# Patient Record
Sex: Female | Born: 1935 | Race: White | Hispanic: No | Smoking: Never smoker
Health system: Southern US, Community
[De-identification: ages and names within clinical notes are randomized; demographics above are authoritative.]

## PROBLEM LIST (undated history)

## (undated) DIAGNOSIS — M1990 Unspecified osteoarthritis, unspecified site: Secondary | ICD-10-CM

## (undated) DIAGNOSIS — I2510 Atherosclerotic heart disease of native coronary artery without angina pectoris: Secondary | ICD-10-CM

## (undated) DIAGNOSIS — I10 Essential (primary) hypertension: Secondary | ICD-10-CM

## (undated) DIAGNOSIS — T8859XA Other complications of anesthesia, initial encounter: Secondary | ICD-10-CM

## (undated) DIAGNOSIS — I4891 Unspecified atrial fibrillation: Secondary | ICD-10-CM

## (undated) DIAGNOSIS — T4145XA Adverse effect of unspecified anesthetic, initial encounter: Secondary | ICD-10-CM

## (undated) DIAGNOSIS — G5600 Carpal tunnel syndrome, unspecified upper limb: Secondary | ICD-10-CM

## (undated) HISTORY — DX: Atherosclerotic heart disease of native coronary artery without angina pectoris: I25.10

## (undated) HISTORY — DX: Unspecified atrial fibrillation: I48.91

## (undated) HISTORY — DX: Essential (primary) hypertension: I10

## (undated) HISTORY — PX: TONSILLECTOMY: SUR1361

## (undated) HISTORY — PX: CHOLECYSTECTOMY: SHX55

## (undated) HISTORY — PX: INNER EAR SURGERY: SHX679

## (undated) HISTORY — PX: EYE SURGERY: SHX253

---

## 2002-06-04 ENCOUNTER — Emergency Department (HOSPITAL_COMMUNITY): Admission: EM | Admit: 2002-06-04 | Discharge: 2002-06-04 | Payer: Self-pay | Admitting: Emergency Medicine

## 2003-11-16 ENCOUNTER — Encounter (INDEPENDENT_AMBULATORY_CARE_PROVIDER_SITE_OTHER): Payer: Self-pay | Admitting: Specialist

## 2003-11-16 ENCOUNTER — Inpatient Hospital Stay (HOSPITAL_COMMUNITY): Admission: EM | Admit: 2003-11-16 | Discharge: 2003-11-18 | Payer: Self-pay | Admitting: Emergency Medicine

## 2005-08-09 ENCOUNTER — Encounter: Payer: Self-pay | Admitting: Cardiovascular Disease

## 2005-08-23 ENCOUNTER — Ambulatory Visit (HOSPITAL_COMMUNITY): Admission: RE | Admit: 2005-08-23 | Discharge: 2005-08-23 | Payer: Self-pay | Admitting: Cardiovascular Disease

## 2007-04-12 ENCOUNTER — Inpatient Hospital Stay (HOSPITAL_BASED_OUTPATIENT_CLINIC_OR_DEPARTMENT_OTHER): Admission: RE | Admit: 2007-04-12 | Discharge: 2007-04-12 | Payer: Self-pay | Admitting: Cardiology

## 2007-07-26 ENCOUNTER — Inpatient Hospital Stay (HOSPITAL_COMMUNITY): Admission: AD | Admit: 2007-07-26 | Discharge: 2007-07-27 | Payer: Self-pay | Admitting: *Deleted

## 2008-03-03 ENCOUNTER — Inpatient Hospital Stay (HOSPITAL_COMMUNITY): Admission: EM | Admit: 2008-03-03 | Discharge: 2008-03-04 | Payer: Self-pay | Admitting: Emergency Medicine

## 2009-07-16 ENCOUNTER — Encounter: Payer: Self-pay | Admitting: Cardiovascular Disease

## 2009-10-15 ENCOUNTER — Ambulatory Visit: Payer: Self-pay | Admitting: Cardiovascular Disease

## 2010-04-13 ENCOUNTER — Ambulatory Visit (INDEPENDENT_AMBULATORY_CARE_PROVIDER_SITE_OTHER): Payer: Medicare Other | Admitting: Cardiovascular Disease

## 2010-04-13 DIAGNOSIS — I2510 Atherosclerotic heart disease of native coronary artery without angina pectoris: Secondary | ICD-10-CM

## 2010-04-13 DIAGNOSIS — I4891 Unspecified atrial fibrillation: Secondary | ICD-10-CM

## 2010-04-15 ENCOUNTER — Ambulatory Visit: Payer: Self-pay | Admitting: Cardiovascular Disease

## 2010-05-17 LAB — URINE CULTURE: Colony Count: >=100000

## 2010-05-17 LAB — BASIC METABOLIC PANEL
BUN: 14 mg/dL (ref 6–23)
BUN: 21 mg/dL (ref 6–23)
CO2: 25 mEq/L (ref 19–32)
CO2: 31 mEq/L (ref 19–32)
Calcium: 10.3 mg/dL (ref 8.4–10.5)
Calcium: 9.1 mg/dL (ref 8.4–10.5)
Chloride: 94 mEq/L — ABNORMAL LOW (ref 96–112)
Chloride: 96 mEq/L (ref 96–112)
Creatinine, Ser: 0.83 mg/dL (ref 0.4–1.2)
Creatinine, Ser: 1.05 mg/dL (ref 0.4–1.2)
GFR calc Af Amer: >60 mL/min (ref >60)
GFR calc Af Amer: >60 mL/min (ref >60)
GFR calc non Af Amer: 50 mL/min — ABNORMAL LOW (ref >60)
GFR calc non Af Amer: >60 mL/min (ref >60)
Glucose, Bld: 130 mg/dL — ABNORMAL HIGH (ref 70–99)
Glucose, Bld: 162 mg/dL — ABNORMAL HIGH (ref 70–99)
Potassium: 3 mEq/L — ABNORMAL LOW (ref 3.5–5.1)
Potassium: 4.1 mEq/L (ref 3.5–5.1)
Sodium: 132 mEq/L — ABNORMAL LOW (ref 135–145)
Sodium: 135 mEq/L (ref 135–145)

## 2010-05-17 LAB — CARDIAC PANEL(CRET KIN+CKTOT+MB+TROPI)
CK, MB: 2.5 ng/mL (ref 0.3–4.0)
CK, MB: 2.6 ng/mL (ref 0.3–4.0)
Relative Index: 2.4 (ref 0.0–2.5)
Relative Index: INVALID (ref 0.0–2.5)
Total CK: 105 U/L (ref 7–177)
Total CK: 76 U/L (ref 7–177)
Troponin I: 0.01 ng/mL (ref 0.00–0.06)
Troponin I: 0.02 ng/mL (ref 0.00–0.06)

## 2010-05-17 LAB — BRAIN NATRIURETIC PEPTIDE
Pro B Natriuretic peptide (BNP): 136 pg/mL — ABNORMAL HIGH (ref 0.0–100.0)
Pro B Natriuretic peptide (BNP): 212 pg/mL — ABNORMAL HIGH (ref 0.0–100.0)

## 2010-05-17 LAB — URINALYSIS, ROUTINE W REFLEX MICROSCOPIC
Bilirubin Urine: NEGATIVE
Glucose, UA: NEGATIVE mg/dL
Hgb urine dipstick: NEGATIVE
Ketones, ur: NEGATIVE mg/dL
Nitrite: NEGATIVE
Protein, ur: NEGATIVE mg/dL
Specific Gravity, Urine: 1.017 (ref 1.005–1.030)
Urobilinogen, UA: 0.2 mg/dL (ref 0.0–1.0)
pH: 6 (ref 5.0–8.0)

## 2010-05-17 LAB — CBC
HCT: 37.2 % (ref 36.0–46.0)
HCT: 42.5 % (ref 36.0–46.0)
Hemoglobin: 12.4 g/dL (ref 12.0–15.0)
Hemoglobin: 14.5 g/dL (ref 12.0–15.0)
MCHC: 33.5 g/dL (ref 30.0–36.0)
MCHC: 34.2 g/dL (ref 30.0–36.0)
MCV: 88 fL (ref 78.0–100.0)
MCV: 89.4 fL (ref 78.0–100.0)
Platelets: 282 10*3/uL (ref 150–400)
Platelets: 306 10*3/uL (ref 150–400)
RBC: 4.16 MIL/uL (ref 3.87–5.11)
RBC: 4.82 MIL/uL (ref 3.87–5.11)
RDW: 12.7 % (ref 11.5–15.5)
RDW: 12.9 % (ref 11.5–15.5)
WBC: 8.3 10*3/uL (ref 4.0–10.5)
WBC: 8.6 10*3/uL (ref 4.0–10.5)

## 2010-05-17 LAB — POCT I-STAT, CHEM 8
BUN: 23 mg/dL (ref 6–23)
Calcium, Ion: 1.23 mmol/L (ref 1.12–1.32)
Chloride: 96 mEq/L (ref 96–112)
Creatinine, Ser: 1.3 mg/dL — ABNORMAL HIGH (ref 0.4–1.2)
Glucose, Bld: 163 mg/dL — ABNORMAL HIGH (ref 70–99)
HCT: 46 % (ref 36.0–46.0)
Hemoglobin: 15.6 g/dL — ABNORMAL HIGH (ref 12.0–15.0)
Potassium: 4.1 mEq/L (ref 3.5–5.1)
Sodium: 134 mEq/L — ABNORMAL LOW (ref 135–145)
TCO2: 30 mmol/L (ref 0–100)

## 2010-05-17 LAB — CK TOTAL AND CKMB (NOT AT ARMC)
CK, MB: 3.2 ng/mL (ref 0.3–4.0)
Relative Index: 2.6 — ABNORMAL HIGH (ref 0.0–2.5)
Total CK: 122 U/L (ref 7–177)

## 2010-05-17 LAB — POCT CARDIAC MARKERS
CKMB, poc: 2 ng/mL (ref 1.0–8.0)
Myoglobin, poc: 111 ng/mL (ref 12–200)
Troponin i, poc: <0.05 ng/mL (ref 0.00–0.09)

## 2010-05-17 LAB — DIFFERENTIAL
Basophils Absolute: 0 10*3/uL (ref 0.0–0.1)
Basophils Relative: 0 % (ref 0–1)
Eosinophils Absolute: 0.2 10*3/uL (ref 0.0–0.7)
Eosinophils Relative: 2 % (ref 0–5)
Lymphocytes Relative: 38 % (ref 12–46)
Lymphs Abs: 3.1 10*3/uL (ref 0.7–4.0)
Monocytes Absolute: 0.8 10*3/uL (ref 0.1–1.0)
Monocytes Relative: 10 % (ref 3–12)
Neutro Abs: 4.1 10*3/uL (ref 1.7–7.7)
Neutrophils Relative %: 50 % (ref 43–77)

## 2010-05-17 LAB — URINE MICROSCOPIC-ADD ON

## 2010-05-17 LAB — PROTIME-INR
INR: 0.9 (ref 0.00–1.49)
Prothrombin Time: 11.9 seconds (ref 11.6–15.2)

## 2010-05-17 LAB — APTT: aPTT: 22 seconds — ABNORMAL LOW (ref 24–37)

## 2010-05-17 LAB — TROPONIN I: Troponin I: 0.01 ng/mL (ref 0.00–0.06)

## 2010-05-17 LAB — MAGNESIUM: Magnesium: 2.2 mg/dL (ref 1.5–2.5)

## 2010-06-14 NOTE — Discharge Summary (Signed)
Hayley Long, Hayley Long NO.:  [id number]   MEDICAL RECORD NO.:  [id number]          PATIENT TYPE:  INP   LOCATION:  3733                         FACILITY:  [facility]   PHYSICIAN:  Ramiro Harvest, MD    DATE OF BIRTH:  Dec 22, 1920   DATE OF ADMISSION:  03/03/2008  DATE OF DISCHARGE:  03/04/2008                               DISCHARGE SUMMARY   PRIMARY CARE PHYSICIAN:  Caryn Bee L. Little, M.D. of [facility].   DISCHARGE DIAGNOSES:  1. Syncope of unknown etiology likely secondary to urinary tract      infection versus mild  dehydration/orthostasis.  1. Hypokalemia.  2. Dehydration.  3. Paroxysmal atrial fibrillation with left bundle branch block on      electrocardiogram in normal sinus rhythm on day of discharge.  4. Urinary tract infection.  5. Prior history of syncope felt to be secondary to bradycardia and      medication induced. 7.  History of congestive heart failure.  Last      cath March 2009 noting occluded left anterior descending, moderate      disease including a left circumflex being treated medically.  8.      Hypertension.  6. Chronic neck pain.  7. Lower back pain.  8. Status post cataract surgery.  9. History of chronic headaches.  10.Questionable depression.   DISCHARGE MEDICATIONS:  1. Ciprofloxacin 250 mg p.o. b.i.d. x 6 days.  2. Aspirin 81 mg p.o. daily  3. HCTZ 12.5 mg p.o. daily  4. Diovan 160 mg p.o. daily.  5. Calcium as previously taken.  6. Norvasc 5 mg p.o. daily.  7. KCl 8 mEq p.o. daily.  8. Nattokinase take as previously taken.   DISPOSITION:  The patient will be discharged home.  The patient was  adamant in being discharged on the day of discharge.   FOLLOW UP:  1. The patient is to follow up with Cardiology in 1 week for      outpatient 2-D echo and further follow up on this syncope.  The      patient will need a basic metabolic profile checked to follow up on      her electrolytes.  2. The patient is to follow up  with PCP in 1 week.  PCP will need to      follow up on the patient's urine cultures as they were not back by      day of discharge.  The patient was discharged home on Cipro 250 mg      p.o. b.i.d.  Basic metabolic profile needs to be checked on      followup to follow up on electrolytes and renal function.   CONSULTATIONS:  A Cardiology consult was done.  The patient was seen in  consultation by Dr. Elease Hashimoto on March 03, 2008.   PROCEDURES PERFORMED:  1. A chest x-ray was performed on March 03, 2008 that showed low      lung volumes, otherwise no acute cardiopulmonary abnormality,      diffuse idiopathic skeletal hyperostosis  2. CT of the head without contrast was done on  March 03, 2008 that      showed ventriculomegaly disproportionate to degree of atrophy.      This may represent a normal pressure hydrocephalus, mild atrophy      and white matter disease.  This likely reflects the sequelae of      chronic microvascular ischemia, atherosclerosis, prosthetic left      stapes.  3. CT angiogram of the chest was done on March 03, 2008 that showed      no evidence of pulmonary emboli or other acute abnormalities.   BRIEF ADMISSION HISTORY/PHYSICAL:  Ms. Hayley Long is an 77 year old  white female with a past medical history of extensive coronary artery  disease, medically managed CHF, previous episodes of syncope which in  the past have been related to bradycardia, who currently lives at home  with her son.  On the day of admission had a passing out spell.  The  patient was quite anxious,  somewhat depressed and difficult to get a  consistent story, but according to the patient, she had been seen by Dr.  Elease Hashimoto.  She had previous multiple syncopal episodes and had been told  it was because of her heart.  According to medical records, her syncope  in the past had been attributed to Inderal and bradycardia.  This  medication was discontinued. According to her son, she has been  having  lots of lightheaded episodes, but no actual syncopal episodes until on  the day of admission when the patient actually passed out.  The son had  a difficult time waking her up and brought her to the emergency room.  When the patient came to the ED, she was noted to be borderline  hypotensive, but was not tachycardiac.  EKG done showed AFib which was  not seen on previous records as well as the left bundle branch block  which was not documented previously.  In addition, the patient was noted  to have a moderate urinary tract infection, although she did not show  any signs of systemic infection or significant dehydration.  Her renal  function was within normal range.  The patient herself was quite  depressed and anxious.  Absolutely did not want to stay in the hospital  even overnight and it took some coaxing for her to do that.  She told  the admitting physician that she is 68 and felt like this was the end.  She wished that he could have given her some medicine to make it end.  She stated to him that she just hurt so badly especially in her neck and  back that she could barely stand it.  She denied any chest pain or  shortness of breath.  Denied any headaches.  No visual changes or  dysphagia.  She denied any palpitations.  No wheezing, no cough and no  abdominal pain.  No hematuria, no dysuria, no constipation or diarrhea.  Denied any focal extremity numbness, weakness or pain other than that  described above.  However in regards to her syncope, she said she had  that before, although again the son clarified at the last time she had a  syncopal episode was last summer.  At that time, she was brought in and  in the interim has been having a lot of lightheaded episodes.   PHYSICAL EXAMINATION:  VITAL SIGNS:  Temperature 97, heart rate 99,  blood pressure 98/60 up to 131/73, going down as low as 86/56,  respiratory rate of 18, satting 97%  on 2 liters.  Heart rate anywhere  from 103  up to 128.  GENERAL:  The patient was alert and oriented x3.  HEENT:  Normocephalic, atraumatic.  Moist mucous membranes.  No carotid  bruits.  CARDIOVASCULAR:  Regular rhythm with a 2/6 systolic ejection  murmur.  LUNGS:  Clear to auscultation bilaterally.  ABDOMEN:  Soft, obese, nontender, positive bowel sounds.  EXTREMITIES:  No clubbing, cyanosis, trace pitting edema.   LABORATORY DATA:  Admission labs CBC:  White count 8.6, hemoglobin 14.5,  hematocrit 43, MCV of 88, platelet count of 306, INR was therapeutic.  UA showed moderate leukocyte esterase, too numerous to count white blood  cells, only a few bacteria.  Troponin-I of 0.01, CPK of 122, MB of 3.2,  magnesium level of 2.2, sodium of 135, potassium 4.1, chloride 94,  bicarb 31, BUN 21, creatinine of 1.05, glucose of 162.   Discharge labs:  Sodium 132, potassium 3.0, chloride 96, bicarb 25, BUN  14, creatinine 0.83, glucose of 130, calcium of 9.1.  CBC:  White count  8.3, hemoglobin 12.4, platelets of 282, hematocrit of 37.2, ANC of 4.1.   HOSPITAL COURSE:  1. Syncope.  The patient was brought in with a syncopal episode.      There was a possibility of being cardiac in nature as the patient      had prior episodes of syncope.  In addition to the fact that there      was a new AFib seen on EKG per admitting physician and questionable      left bundle branch block.  The CT of the head was obtained with      results as stated above.  Neurological exam was unremarkable and      intact.  Cardiac enzymes were cycled which came back negative x3.      Cardiology was consulted.  The patient was seen in consultation by      Dr. Elease Hashimoto who did not feel this was not cardiac in nature, felt it      might be likely secondary to orthostasis.  The patient had been      placed on IV fluids during the hospitalization to improve her fluid      status.  A head CT was obtained with results as stated above.  A CT      angio of the chest was also  obtained which was negative for      pulmonary embolism.  The patient did not have any further syncopal      episodes during the rest of the hospitalization.  The patient will      follow up with cardiology in 1 week.  We will get an outpatient 2-D      echo.  The patient was also treated for urinary tract infection      with IV Rocephin which may have contributed to her syncopal      episode.  The patient will be transitioned to oral antibiotics to      complete a 7 day course for her urinary tract infection.  The      patient will be discharged in stable condition to follow up with      cardiologist in 1 week and the PCP in another week.  The patient      was very adamant on being discharged today.  2. Dehydration.  The patient was noted to be dehydrated on admission      with systolic blood pressures  dropping in the mid 80s.  The patient      was hydrated with IV fluids and the patient was euvolemic by day of      discharge.  Orthostatics were checked and the patient was not      orthostatic on day of discharge.  The patient will be discharged      home in stable condition.  3. Urinary tract infection.  The patient was noted on admission by UA      to have a urinary tract infection.  The patient was placed on IV      Rocephin.  The patient will be switched to oral ciprofloxacin 250      mg p.o. b.i.d. for 6 more days to complete a 1 week course.  The      patient's urine cultures will need to be followed up per her PCP.  4. Hypokalemia.  The patient was noted to be hypokalemic during the      hospitalization.  The patient's potassium was repleted.  The      patient will be discharged home.  The patient is on chronic      potassium at home.  This will need to be followed up as an      outpatient.  5. New onset atrial fibrillation with questionable left bundle branch      block.  The patient converted into normal sinus rhythm and remained      in normal sinus rhythm for the rest of the  hospitalization.  The      patient was followed up per cardiology.  The patient will follow up      with cardiology as an outpatient.  An outpatient 2-D echo will be      done per Dr. Elease Hashimoto.  6. Depression.  On admission, the patient seemed as if might have a      component of depression going on.  Per the patient's son, the      patient was just depressed at being in the hospital.  The patient      did not have any suicidal ideations.  The patient was back at her      regular baseline on day of discharge with no signs or symptoms of      depression.  This will need to be followed up as an outpatient per      PCP.  7. The rest of the patient's chronic medical issues remained stable      throughout the hospitalization.  The patient will be discharged in      stable and improved condition.   On day of discharge vital signs:  Temperature 98.3, blood pressure  136/73, pulse of 65, respiratory rate 18, satting 91% on room air.   It was a pleasure taking care of Ms. Aurea Graff.      Ramiro Harvest, MD  Electronically Signed     DT/MEDQ  D:  03/04/2008  T:  03/04/2008  Job:  [id number]   cc:   Caryn Bee L. Little, M.D.  Vesta Mixer, M.D.

## 2010-06-14 NOTE — Consult Note (Signed)
NAMEDORIEN, BESSENT NO.:  [id number]   MEDICAL RECORD NO.:  [id number]          PATIENT TYPE:  INP   LOCATION:  3733                         FACILITY:  [facility]   PHYSICIAN:  Vesta Mixer, M.D. DATE OF BIRTH:  [birth year]   DATE OF CONSULTATION:  03/03/2008  DATE OF DISCHARGE:                                 CONSULTATION   Hayley Long is an elderly female with a known history of coronary  artery disease, hypertension, and bradycardia.  She was admitted today  to the hospital after having an episode of syncope.  We were asked by  Dr. Rito Ehrlich to see the patient for further evaluation regarding her  syncope.   Hayley Long is an 77 year old female with a known history of coronary  artery disease.  She has an occluded left anterior descending artery,  which fills via collaterals.  She has been relatively stable and has had  some episodes of angina, but they have always been fairly short lived.   She has had some weak spells that have been not very clearly defined.   She has had some problems with hypertension.   She has been a little bit more depressed recently.   She was at home today and went to the bathroom.  When she got up, she  abruptly fell.  She was found by her son who heard her fall.  She was  out for a couple of minutes, in fact fully did not become completely  oriented until EMS arrived, probably 10 minutes later.  She was brought  to the hospital.  She does not remember anything of episode.  She has  not had any episodes of chest pain or shortness breath.  She has never  had any episodes of syncope similar to this before.  She denies any  bowel or bladder incontinence.  There was no seizure activity noted  according to the son.   She denies any PND or orthopnea.  She denies any worsening of her chest  pain or shortness breath.  She does have some chest pain now that is  pleuritic in nature.  It hurts with a deep breath in, with a cough, and  is centered on the right side.   The patient denies any heat or cold intolerance, weight gain, or weight  loss.   CURRENT MEDICATIONS:  1. Aspirin 81 mg a day.  2. Potassium chloride 8 mEq a day.  3. Calcium twice a day.  4. Osteo-Bi-Flex twice a day.  5. Hydrochlorothiazide 12.5 mg a day.  6. Diovan 160 mg a day.  7. Norvasc 5 mg a day.  8. Multivitamin once a day.  9. Nitroglycerin as needed.  10.Nattokinase once a day.   She is allergic/intolerant to CRESTOR, TRICOR, and LIPITOR.   PAST MEDICAL HISTORY:  1. Coronary artery disease:  She has an occluded left anterior      descending artery that fills via right-to-left collaterals from a      RV marginal branch.  The left circumflex artery has mild      irregularities between 30 and 40%.  The right coronary artery is      small and is nondominant, but it does have a large RV marginal      branch, which supplies the LAD.  She has normal left ventricular      systolic function with an EF of around 65%.  2. History of hypertension.  3. Anxiety and depression.   SOCIAL HISTORY:  The patient is a nonsmoker.  She does not use any  alcohol.   FAMILY HISTORY:  Her father died of a myocardial infarction at age 51.  Her mother died of myocardial infarction at age 43.   REVIEW OF SYSTEMS:  Reviewed in the HPI and all other systems were  reviewed and are negative.   PHYSICAL EXAMINATION:  GENERAL:  She is an elderly female, in no acute  distress.  She did appear to be somewhat depressed.  She is alert and  oriented x3 and her mood and affect are fairly normal, perhaps a little  bit more depressed than usual.  VITAL SIGNS:  Her temperature is 97.5.  Her pulse is 73, blood pressure  is 120/69.  Her O2 sat is 98% on 2 liters.  Her weight is 78 kg.  HEENT:  She is normocephalic and atraumatic.  NECK:  Supple.  Carotids are 2+ without bruits.  There is no JVD, no  thyromegaly.  LUNGS:  Clear.  Her chest wall is nontender.  HEART:   Regular regular rate.  S1 and S2.  ABDOMEN:  Good bowel sounds and is nontender.  There is no  hepatosplenomegaly.  No masses or bruits.  EXTREMITIES:  She has no clubbing, cyanosis, or edema.  SKIN:  Without rashes.  Her gait was not able to be assessed today.  NEUROLOGIC:  Nonfocal.   LABORATORY DATA:  Her cardiac enzymes are negative.  Her creatinine is  1.5.  Her sodium is 135.  Urinalysis is consistent with a urinary tract  infection.   Her CT of her head reveals moderate degree of atrophy.  There was  ventricular enlargement that seem to be out of proportion to the degree  of atrophy, suggestive of normal-pressure hydrocephalus.   Her telemetry strips and her EKG in the emergency room revealed atrial  fibrillation with a rapid ventricular response.  Telemetry strips here  revealed normal sinus rhythm.   1. Hayley Long presents with syncope, new-onset atrial fibrillation,      and pleuritic chest pain.  I would like to do a CT angio of her      chest to rule out a pulmonary embolus.  I did not detect any      palpable cords in her legs, although she certainly is at high risk.      She is not all that mobile.  We will get a CT angio tonight.  We      will start her on heparin and transition her to Coumadin if that is      abnormal.  I do not think that this is an acute coronary syndrome.  She has a  negative cardiac enzymes.  Her angina has been relatively stable up to  now.  1. Depression:  She clearly has some depression.  I get the feeling      that she thinks she is going to die and she      really does not want to die in the hospital.  She has begged me to      send her home.  We  have convinced her to stay tonight to get some      additional testing.  2. Urinary tract infection:  Cultures have been obtained.  Dr.      Rito Ehrlich has started her on Rocephin.      Vesta Mixer, M.D.  Electronically Signed     PJN/MEDQ  D:  03/03/2008  T:  03/04/2008  Job:   [id number]   cc:   Hollice Espy, M.D.  Anna Genre Little, M.D.

## 2010-06-14 NOTE — H&P (Signed)
NAMEMANA, HABERL NO.:  [id number]   MEDICAL RECORD NO.:  [id number]          PATIENT TYPE:  EMS   LOCATION:  MAJO                         FACILITY:  [facility]   PHYSICIAN:  Hollice Espy, M.D.DATE OF BIRTH:  August 13, 1920   DATE OF ADMISSION:  03/03/2008  DATE OF DISCHARGE:                              HISTORY & PHYSICAL   PATIENT'S PCP:  Anna Genre. Little, M.D.   CARDIOLOGIST:  Vesta Mixer, M.D.   CHIEF COMPLAINT:  Syncope.   HISTORY OF PRESENT ILLNESS:  The patient is an 77 year old white female  with past medical history of extensive CAD medically managed, CHF, and  previous episodes of syncope which in the past have been related to  bradycardia who currently lives at home with her son and today had a  passing out spell.  The patient is quite anxious and somewhat depressed  and it is difficult to get a consistent story, but according to the  patient she has been seen by Vesta Mixer, M.D. and she has had  previous multiple syncopal episode has been told it is because of her  heart.  According to the medical records her syncope in the past has  been attributed to Inderal  and bradycardia and this medication was  discontinued.  According to her son she has been having a lot of  lightheaded episodes, but no actual syncopal episodes until today when  the patient actually passed out today and the son had a difficult time  waking her he had a brought into the emergency room.  When the patient  came into the emergency room she was noted to be borderline hypotensive.  She was not tachycardic.  Her EKG done showed a atrial fibrillation  which I am not seeing in the previous record, as well as left bundle-  branch-block which I am not seeing documented previously.  In addition  the patient was noted to have a moderate urinary tract infection,  although she did not show signs of systemic infection or significant  dehydration.  Her renal function was within  the normal range limit.  The  patient herself is quite depressed and anxious.  She absolutely does not  want to stay in the hospital even overnight and it took some coaxing for  her to do.  She tells me that she is 48 and she feels like this is the  end.  She wishes that I would give her some medicine to make it the end  and she says that she just hurts so bad, especially in her neck and back  that she can barely stand it.  She denies any chest pain or shortness of  breath.  She denies any headaches, vision changes or dysphagia.  She  denies any palpitations.  No wheezing, no coughing, no abdominal pain.  No hematuria, dysuria, constipation, or diarrhea.  She denies any focal  extremity numbness, weakness or pain other than described above.  However, in regards to syncope she says she has had this before,  although again, her son clarifies that the last time she had a  syncopal  episode was last summer and that time she was brought in and in the  interim, she gets a lot of lightheaded episodes.   PAST MEDICAL HISTORY:  Includes:  1. History of syncope felt to be secondary to bradycardia and      medication.  2. History of CHF.  Her cardiac catheterization was done in March of      2009, noting occluded LAD, moderate disease including the left      circumflex and medical management.  A ejection fraction was not      commented on at this time.  3. History of hypertension.  4. Chronic neck pain.  5. Lower back pain.  6. Cataract surgery.  7. History of chronic headaches.   MEDICATIONS:  Her medicines currently are as follows:  1. Diovan 160 p.o. daily  2. Aspirin 81 p.o. daily.  3. Hydrochlorothiazide 12.5 p.o. daily.   She has no known drug allergies.   SOCIAL HISTORY:  She denies any tobacco, alcohol or drug use.  She lives  at home with her son.   FAMILY HISTORY:  Noncontributory.   PHYSICAL EXAMINATION:  The patient's vitals on admission, temperature  97, heart rate 99,  blood pressure 98/60, it  has come to as high as  131/73, down as low as 86/56 while she has been here, respiratory rate  is 18, O2 SAT 97% 2 L, heart rate ranges anywhere from 103 up to as high  as 128.  IN GENERAL:  She is alert and oriented x3,  HEENT:  Normocephalic atraumatic.  Mucous membranes moist.  She has no carotid bruits.  HEART:  Irregular rhythm.  When I examined her she was currently under  100, an  appreciable soft 2/6 systolic ejection murmur.  LUNGS:  Clear to auscultation bilaterally.  ABDOMEN:  Soft, obese, nontender, positive bowel sounds.  EXTREMITIES:  Show no clubbing, cyanosis.  Trace pitting edema.   LAB WORK:  White count 8.6, H and H 14.5 and 43, MCV of 88, platelet  count 306.  Her INRs are therapeutic.  UA notes moderate leukocyte  esterase with too numerous to count white cells, but a only few  bacteria.  Troponin-I of 0.01.  CPK 122, MB 3.2.  Magnesium level stable  at 2.2.  Sodium of 135, potassium 4.1, chloride 94, bicarb 31, BUN 21,  creatinine 1.05, glucose 162.   ASSESSMENT/PLAN:  1. Syncope in a patient with a history of congestive heart failure and      coronary artery disease.  2. Atrial fibrillation, questionable new onset  3. New left bundle-branch-block as a sign of worsening coronary artery      disease.  4. Cystitis, possible urinary tract infection.  5. Depression.   This will be difficult to manage the patient.  The patient is pretty  adamant that at best she will stay here overnight.  I have consulted  Vesta Mixer, M.D. who knows this patient very well and can get his  advice on how to proceed.  Likely the best thing to do would be able to  treat the patient's syncope.  It does not look from bradycardia, it  looks to be  slightly more hypotension.  She does not look to be in full  onset heart failure and I question if any of this is from this possible  UTI.  Would plan to gently hydrate.  Her BNP is currently normal.  We  will  go ahead and treat IV  antibiotics.  The other episode, in regard to her depression, she is a  no code blue and I question if the best thing may be to get hospice care  services if Vesta Mixer, M.D. can provide some insight as far as  her life expectancy.  Perhaps we can better manage her pain at home and  set up hospice services.      Hollice Espy, M.D.  Electronically Signed     SKK/MEDQ  D:  03/03/2008  T:  03/03/2008  Job:  [id number]   cc:   Caryn Bee L. Little, M.D.  Vesta Mixer, M.D.

## 2010-06-14 NOTE — Discharge Summary (Signed)
NAMEMAKENLY, LARABEE NO.:  [id number]   MEDICAL RECORD NO.:  [id number]          PATIENT TYPE:  INP   LOCATION:  3707                         FACILITY:  [facility]   PHYSICIAN:  Colleen Can. Deborah Chalk, M.D.DATE OF BIRTH:  [birth year]   DATE OF ADMISSION:  07/26/2007  DATE OF DISCHARGE:  07/27/2007                               DISCHARGE SUMMARY   DISCHARGE DIAGNOSES:  1. Presyncope.  2. Recent frank syncope 2 weeks ago.  3. Known ischemic heart disease.  She has refused even consideration      for coronary artery bypass grafting.  She has been managed      medically.  4. Chronic bradycardia, now off of Inderal.  5. Chronic pain syndrome.  6. Hypertension.   HISTORY OF PRESENT ILLNESS:  The patient is an 77 year old white female  who has multiple medical problems.  She presented for transfer from [facility] to the emergency room after having a passing  out spell.  She was actually going to see the neurologist in [facility]  when she was in route with her son, she apparently got white as she was  very sweaty and clammy.  She really had no chest discomfort but her son  gave her nitroglycerin with worsening of her symptoms.  She thought she  was going to die.  She did have an episode of frank syncope  approximately 2 weeks ago where she fell and hit her chin.  She did not  seek evaluation for that.  She was subsequent transferred to [facility]  for further evaluation.   Please see the dictated history and physical for further patient  presentation and profile.   LABORATORY DATA:  Her chest x-ray was negative.  TSH was normal.  Cardiac enzymes were negative x3; however, her troponins were mildly  elevated at 0.29 to 0.31 to 0.28.  Chemistries were normal except for a  glucose of 208.  CBC was normal.   Her EKG showed sinus bradycardia.   HOSPITAL COURSE:  The patient was transferred from [facility]  ER here to [facility].   Initially, she really refused  evaluation and wanted to be discharged home.  She currently consented  for overnight stay on telemetry.  Her Inderal was discontinued.  She had  no profound bradycardia documented.  At this point in time, she does not  wish for further evaluation and seeks discharge today.   DISCHARGE CONDITION:  Stable.   DISCHARGE DIET:  Low-salt, heart-healthy.   DISCHARGE MEDICINES:  We will be stopping her Inderal.  We will  continue;  1. Diovan 160 a day.  2. Norvasc 5 a day.  3. Aspirin, calcium.  4. Hydrochlorothiazide 25 mg half tablet daily.  5. KCl 8 mEq a day.  6. Multivitamin daily.  7. Tramadol 4 times a day.  8. Nattokinase daily.   We will have her follow up with Dr. Elease Hashimoto in 1 week, certainly sooner  if any problems arise.      Sharlee Blew, N.P.      Colleen Can. Deborah Chalk, M.D.  Electronically Signed    LC/MEDQ  D:  07/27/2007  T:  07/27/2007  Job:  [id number]

## 2010-06-14 NOTE — H&P (Signed)
NAMEAKYAH, LAGRANGE NO.:  [id number]   MEDICAL RECORD NO.:  [id number]          PATIENT TYPE:  INP   LOCATION:  3707                         FACILITY:  [facility]   PHYSICIAN:  Colleen Can. Deborah Chalk, M.D.DATE OF BIRTH:  09-24-1920   DATE OF ADMISSION:  07/26/2007  DATE OF DISCHARGE:                              HISTORY & PHYSICAL   CHIEF COMPLAINT:  Presyncope.   HISTORY OF PRESENT ILLNESS:  The patient is an 77 year old white female  who has multiple medical problems.  She presents for transfer to [facility] actually from the Emergency Room at [facility]  today.  She was on her way with her son to see the neurologist in [facility].  Apparently, she has chronic headaches as well as chronic neck  pain and requires chronic narcotics.  En route, she became white as a  sheet, she got very sweaty and clammy.  She told her son she thought she  was going to die.  He did give her nitroglycerin, which made her  symptoms worse.  She has had a history of syncope 2 weeks ago, in which  she had a chance, she did not seek evaluation.  She is brought here to  the hospital, she is noted to be somewhat bradycardic.  She is now  admitted for further evaluation.   PAST MEDICAL HISTORY:  1. Known history of coronary artery disease.  Her last catheterization      was in March 2009, which showed 70% stenosis in the LAD, circumflex      has 30-40%.  The LAD is basically occluded with collaterals filling      from the right coronary artery.  She has been managed medically.      She has refused even consideration for coronary artery bypass      grafting.  2. Hypertension.  3. Chronic bradycardia.  4. Chronic neck pain.  5. Cataract surgery.  6. Severe arthritis.  7. Chronic headaches.  8. Intolerance to statins.   ALLERGIES:  None.   CURRENT MEDICATIONS:  1. Aspirin.  2. KCl 8 mEq a day.  3. Calcium.  4. Inderal  80 mg a day.  5. Hydrochlorothiazide 12.5 a  day.  6. Diovan 160  a day.  7. Norvasc 5 a day.  8. Multivitamin.  9. Tramadol 4 times a day.  10.Nattokinase, which is an herb for cholesterol.   FAMILY HISTORY:  Noncontributory.   SOCIAL HISTORY:  She lives with her son.  She has no current alcohol or  tobacco use.   REVIEW OF SYSTEMS:  She know she really has not felt well since she had  a cataracts done 2-3 years ago.  She has subsequently been left to have  chronic headaches.  Her chest pain has been increasingly worse over the  last several weeks, but she has refused consideration for bypass  surgery.  She really is very hesitant to proceed on with any type of  further intervention.  She has been bradycardic in the past and has a  long history that is documented in her  medical record.   PHYSICAL EXAMINATION:  GENERAL:  She is currently in no acute distress.  VITAL SIGNS:  Blood pressure is 153/68, her heart rates in the low 50s,  respiratory rate 18, afebrile.  SKIN:  Warm and dry.  Color is somewhat pale.  LUNGS:  Clear.  HEART:  Shows a regular rhythm.  ABDOMEN:  Obese and soft.  Positive bowel sounds and nontender.  EXTREMITIES:  Without edema.  NEUROLOGIC:  Shows no gross focal deficits.  She does have some  ecchymosis at the chin area from recent fall.   LABORATORY DATA:  Her troponin is elevated at 0.33.  CK was negative.  Chest x-ray that was negative.  CBC was normal.  Chemistries were normal  except for potassium of 3.4 and glucose of 136.   OVERALL IMPRESSION:  1. Recurrent episode of presyncope.  2. Known coronary disease with increasing episodes of chest pain.  3. Intolerance to statins.   PLAN:  She is very hesitant to proceed on with any further type of  evaluation.  She is already asking to go home.  We will try to monitor  on telemetry.  Tonight, her Inderal will be discontinued.  Cardiac  profiles will be drawn accordingly.      Sharlee Blew, N.P.      Colleen Can. Deborah Chalk, M.D.   Electronically Signed    LC/MEDQ  D:  07/26/2007  T:  07/27/2007  Job:  [id number]   cc:   Vesta Mixer, M.D.

## 2010-06-14 NOTE — Cardiovascular Report (Signed)
NAMEMarland Kitchen  CARNELIA, OSCAR NO.:  [id number]   MEDICAL RECORD NO.:  [id number]          PATIENT TYPE:  OIB   LOCATION:  1966                         FACILITY:  [facility]   PHYSICIAN:  Vesta Mixer, M.D. DATE OF BIRTH:  [birth year]   DATE OF PROCEDURE:  04/12/2007  DATE OF DISCHARGE:  04/12/2007                            CARDIAC CATHETERIZATION   Hayley Long is an 78 year old female with known coronary artery  disease.  She has an occluded left anterior descending artery.  She  presents with recent worsening of her angina symptoms.  She is referred  for heart catheterization for further evaluation.   PROCEDURE:  Left heart catheterization with coronary angiography.   HEMODYNAMICS:  LV pressure is 163/12.  The aortic pressure is 153/65.   ANGIOGRAPHY:  Left main:  The left main is fairly large and has some  minor luminal irregularities.   The left main has a 70% stenosis at its origin and then is occluded  after giving off a large first diagonal vessel.  The diagonal vessel has  30-40% stenosis at its origin and otherwise no significant  irregularities.   The distal left anterior descending artery fills via collaterals from  the RV marginal branch.   The left circumflex artery is a large branching vessel.  It is dominant.  There are mild to moderate irregularities throughout the course between  30% and 40%.  The first obtuse marginal artery is a very small vessel.  The second obtuse marginal artery is a large vessel with a 40% mid  stenosis.  The posterolateral branches have minor luminal  irregularities.  The posterior descending artery is relatively normal.   The right coronary artery is small and is nondominant.  It has a large  RV marginal branch which provides a very nice collateral to the left  anterior descending artery.  The flow through this vessel is fairly  good.   The left ventriculogram was performed in a 30 RAO position.  It reveals  normal  left ventricular systolic function with an ejection fraction of  65%.   COMPLICATIONS:  None.   CONCLUSIONS:  Coronary arteries and primarily involving the left  anterior descending artery.  She has an occluded LAD with collateral  filling from the right coronary artery.  She has moderate disease  involving the left circumflex artery.   We will continue with medical therapy for now.  She is 77 years old and  is at increased risk for having coronary artery bypass grafting.  Her  coronary disease appears to be stable from several years ago.           ______________________________  Vesta Mixer, M.D.     PJN/MEDQ  D:  04/12/2007  T:  04/13/2007  Job:  [id number]   cc:   Caryn Bee L. Little, M.D.

## 2010-06-14 NOTE — H&P (Signed)
NAMEMarland Kitchen  SHAYE, ELLING NO.:  [id number]   MEDICAL RECORD NO.:  [id number]           PATIENT TYPE:   LOCATION:                                 FACILITY:   PHYSICIAN:  Vesta Mixer, M.D. DATE OF BIRTH:  1920-07-13   DATE OF ADMISSION:  04/08/2007  DATE OF DISCHARGE:                              HISTORY & PHYSICAL   Hayley Long is an elderly female with a history of known coronary  artery disease, hypertension, and bradycardia.  She is referred for  heart catheterization after having some episodes of chest pain  yesterday.   Hayley Long is an 77 year old female with a known history of coronary  artery disease.  She had a heart catheterization in July of 2007 which  revealed a chronic total occlusion of her left anterior descending  artery.  She had collateral filling from a small nondominant right  coronary artery.  She had moderate disease involving the circumflex  artery.  She refused to even consider coronary artery bypass grafting,  and she has done quite well on medical therapy.   On Sunday of this week, she developed some severe chest pain.  It was  associated with diaphoresis, shortness of breath, and nausea.  Her son  stated that she looked very weak and pale.  The pain lasted for some  time, and she took an aspirin and it gradually resolved.  She has not  felt strong since that episode.  She denies any syncope or presyncope.   CURRENT MEDICATIONS:  1. Aspirin 81 mg a day.  2. Potassium chloride 10 mEq a day.  3. Osteo Bi-Flex twice a day.  4. Inderal 80 mg a day.  5. HCTZ 12.5 mg a day.  6. Diovan 160 mg a day.  7. Norvasc 5 mg a day.  8. Multivitamin once a day.  9. __________ kinase once a day.   PHYSICAL EXAMINATION:  GENERAL:  She is an elderly female in no acute  distress.  She is alert and oriented x3.  Her mood and affect are  normal.  VITAL SIGNS:  Weight is 176.  Blood pressure is 142/70, heart rate of  48.  NECK:  2+ carotids.   She has no bruits, no JVD, no thyromegaly.  LUNGS:  Clear to auscultation.  HEART:  Regular rate, S1-S2.  ABDOMEN:  Good bowel sounds, nontender.  EXTREMITIES:  She has no clubbing, cyanosis, or edema.  NEUROLOGIC:  Nonfocal.   Hayley Long presents with some episodes of chest pain, diaphoresis, and  shortness of breath.  She also has significant bradycardia.  I would  like to schedule her for heart catheterization.  We have discussed the  risks, benefits, and options of heart catheterization.  She understands  and agrees to proceed.   Her heart rate is fairly slow, and I suspect that we will need to at  some point discontinue or lower the dose of her Inderal.  At present, it  is providing some antianginal relief, and so I am concerned about  stopping it right away.  Will see if we can get the  heart  catheterization for the next day or so.  If she has any other episodes  of weakness or near-syncope, we will consider stopping the medication.           ______________________________  Vesta Mixer, M.D.     PJN/MEDQ  D:  04/08/2007  T:  04/09/2007  Job:  [id number]   cc:   Caryn Bee L. Little, M.D.

## 2010-06-17 NOTE — Op Note (Signed)
[name], [name] NO.:  [id number]   MEDICAL RECORD NO.:  [id number]          PATIENT TYPE:  INP   LOCATION:  0105                         FACILITY:  [facility]   PHYSICIAN:  Anselm Pancoast. Weatherly, M.D.DATE OF BIRTH:  09-26-1920   DATE OF PROCEDURE:  11/16/2003  DATE OF DISCHARGE:                                 OPERATIVE REPORT   PREOPERATIVE DIAGNOSIS:  Acute cholecystitis.   POSTOPERATIVE DIAGNOSIS:  Acute cholecystitis.   OPERATION:  Laparoscopic cholecystectomy with cholangiogram.   ANESTHESIA:  General.   SURGEON:  Dr. Anselm Pancoast. Weatherly.   ASSISTANT:  Dr. Jimmye Norman.   HISTORY:  Hayley Long is an 77 year old female patient of Dr. Theresia Lo at  [facility] who is referred today after she had had a  CT that showed acute gallbladder at [facility]. The patient states that  she has been having episodes of epigastric pain. Dr. Elease Hashimoto is her  cardiologist, and he had recommended she see her regular physician. She has  just returned from [place] and said that other the last two days she has had  progressive pain, right upper quadrant, with nausea and vomiting. The  patient saw Dr. Theresia Lo this morning who did lab studies and sent her for a  CT that shows acutely inflamed gallbladder. Her white count is 17,000, but  her liver function studies are normal. She was referred over to the ER, and  I saw her, and on examination, she is definitely tender in the right upper  quadrant of her abdomen, and I recommended we proceed with a laparoscopic  cholecystectomy. I talked to Dr. Elease Hashimoto, and he said that she had basically  hypertension and basically controlled congestive heart failure, but she had  no angina.   The patient was given 3 g of Unasyn. She has PSA stockings, positioned on  the OR table, induction of general anesthesia and endotracheal tube and then  oral tube into the stomach. The abdomen was prepped with Betadine and  Surgiscrub solution. She has got a very protuberant abdomen, and abdomen was  prepped with Betadine solution and draped in a sterile manner. A small  incision was made at the umbilicus, the fascia identified. This was  carefully opened, and then a hemostat was used to carefully open into the  peritoneal cavity. A purse-string 0 Vicryl was placed, and then the Hasson  cannula introduced. The upper 10-mm trocar was placed under direct vision.  The two lateral 5-mm trocars were placed at their appropriate position, and  I had anesthetized the fascia for upper trocars. The gallbladder was very  acutely inflamed, very tense. There was bilious fluid around it. I, at  first, aspirated the gallbladder with a __________ aspirator and then could  grasp the gallbladder and retract it upward. The peritoneum at the proximal  portion of the gallbladder was very edematous. This was dissected out. The  cystic duct was identified as was the cystic artery. The cystic artery was  doubly clipped proximally, singly distally divided. The catheter was placed  in the proximal cystic duct. I placed a clip flush with the  gallbladder.  Cholangiogram was obtained. It showed a short cystic duct, good flow into  the duodenum, and a normal extrahepatic biliary system. The catheter was  removed. The cystic duct was triply clipped and divided, and then the rotten  and gangrenous gallbladder was removed. We did have a little bit of bile  spillage from where the grasper was used holding the retractor. There were a  few stones that dropped down. They were small cholesterol stones that were  retrieved, and then the gallbladder was freed from its bed. It was placed in  an EndoCatch bag. The irrigated fluid was aspirated and irrigated, repeat  aspirated, coagulated bed, and then brought the bag containing the  gallbladder out of the umbilical fascia. Placed a second figure-of-eight of  0 Vicryl. I had brought a Blake drain, 19,  out of the lateral 5-mm trocar  and placed in the gallbladder fossa. I cauterized a few little areas of the  bed of the gallbladder, and good hemostasis appeared to have been obtained.  We tried to aspirate much of the fluid as possible. She is very gaseous from  the CT. I did visualize the appendix, and it looks unremarkable. Next, the  irrigated fluid was draining from the drain, and then the carbon dioxide  released after the upper 10-mm trocar was then removed. The subcutaneous  wounds were closed with 4-0 Vicryl, Benzoin and steri strips on the skin. I  am going to keep her on antibiotics for at least 24 hours. She is presently  on Unasyn. I did culture the bile, and I think she can go to the regular  floor.      WJW/MEDQ  D:  11/16/2003  T:  11/17/2003  Job:  [id number]   cc:   Vikki Ports, M.D.  [address] [place]  [place]  [place] [address]  Fax: [phone]

## 2010-06-17 NOTE — H&P (Signed)
Hayley Long, GOTTO NO.:  [id number]   MEDICAL RECORD NO.:  [id number]          PATIENT TYPE:  INP   LOCATION:  0478                         FACILITY:  [facility]   PHYSICIAN:  Anselm Pancoast. Weatherly, M.D.DATE OF BIRTH:  [birth year]   DATE OF ADMISSION:  11/16/2003  DATE OF DISCHARGE:                                HISTORY & PHYSICAL   CHIEF COMPLAINT:  Abdominal pain approximately two to three days' duration.   HISTORY:  Hayley Long is an 77 year old Caucasian female who was referred  to me by Dr. Theresia Lo.  Is also a patient of Dr. Delane Ginger with acute  cholecystitis.  The patient states that she has been having intermittent  episodes of epigastric pain, kind of usually epigastric and is followed by  Dr. Delane Ginger for predominantly hypertension.  She has had a cardiac  catheterization and the ejection fraction was about 55%.  She is 77 years  old and has never had a history of angina.  She was advised to see her  regular physician which she did not do until yesterday.  She had just  returned from a trip to [place] where she had had kind of two days of  increase in right upper quadrant abdominal pain.  She was seen by Dr.  Theresia Lo who referred her for a CT at [facility] and this was consistent  with acute cholecystitis, very swollen, distended, edematous gallbladder  with a lot of pericystic edema and fluid and I was called and she was sent  to the emergency room.  The patient does not appear toxic but she is  definitely tender in the abdomen and has a very large protuberous abdomen.   PAST MEDICAL HISTORY:  She has had no previous abdominal surgery.   ALLERGIES:  Not really.  Says she is possibly allergic to CORTISONE.   MEDICATIONS:  1.  Inderal LA 80 daily.  2.  Hydrochlorothiazide 25 mg.  3.  Baby aspirin.  4.  Norvasc 5 mg daily.  5.  Potassium replacement.  6.  Diovan 160 daily __________.   The patient is divorced.  She has had no colonoscopy  examination.  She does  have a chronic history of constipation.  She had had fever during the  preceding 24 hours.  At Dr. Aurelio Brash office her white count was about  17,000.   PHYSICAL EXAMINATION:  GENERAL:  She appears her stated age.  She is  certainly uncomfortable.  VITAL SIGNS:  Temperature 100.9, pulse 69, respirations 22, blood pressure  154/69.  She is 5 feet 5 inches, weighs 180 pounds.  HEENT:  Appears adequately hydrated.  Not jaundiced.  LYMPH:  No cervical or supraclavicular lymphadenopathy.  LUNGS:  Good breath sounds bilaterally.  CARDIAC:  Normal sinus rhythm.  BREASTS:  Negative.  ABDOMEN:  Very protuberant abdomen.  Definitely tender in the right upper  quadrant.  Not tender in the lower abdomen significantly.  EXTREMITIES:  No definite pedal edema.  Pulses appear adequate.  CNS:  Physiologic.   IMPRESSION:  1.  Acute cholecystitis.  2.  History of out of control congestive  heart failure and hypertension.  3.  Obesity.   PLAN:  Patient was started on IV fluids, antibiotics, and will plan for  urgent laparoscopic cholecystectomy.  I talked to Dr. Delane Ginger and he  said she is basically essentially cleared from urgent surgery and he had  seen the patient approximately two weeks earlier.  She has never had a  history of angina, only hypertension as her predominant cardiac problem.      WJW/MEDQ  D:  11/17/2003  T:  11/17/2003  Job:  [id number]

## 2010-06-17 NOTE — Cardiovascular Report (Signed)
NAMEMarland Kitchen  JALYNNE, PERSICO NO.:  [id number]   MEDICAL RECORD NO.:  [id number]          PATIENT TYPE:  OIB   LOCATION:  2899                         FACILITY:  [facility]   PHYSICIAN:  Vesta Mixer, M.D. DATE OF BIRTH:  [birth year]   DATE OF PROCEDURE:  08/23/2005  DATE OF DISCHARGE:                              CARDIAC CATHETERIZATION   Hayley Long is as an 77 year old female with a history of moderate  coronary artery disease.  She recently had onset of severe shortness of  breath with any sort of exertion.  She had a stress Cardiolite study which  revealed anterior apical ischemia.  She is referred for heart  catheterization for further evaluation.   PROCEDURE:  Left heart catheterization with coronary angiography.   The right femoral artery was easily cannulated using modified Seldinger  technique.   HEMODYNAMIC RESULTS:  The left ventricular pressure 148/11 with an aortic  pressure of 148/57.   ANGIOGRAPHY:  Left main:  The left main has mild irregularities.   Left anterior descending artery has an 80% stenosis at its origin.  It is  incompletely occluded.  It gives off first diagonal artery just prior to  this occlusion.  This first diagonal artery is a moderate sized vessel.  There is no significant stenosis.  The remainder of the LAD fills via  injection from the right coronary artery and seemed to be a moderate to  large vessel with mild to moderate irregularities.   Left circumflex artery is large and dominant.  There are moderate  irregularities between 20-30% throughout the course.  There is a 50%  stenosis distally just prior to giving off the posterolateral branches.  The  circumflex also gives off a posterior descending artery.   RESULTS:  There are several large marginal branches which arise prior to the  50% mid/distal circumflex lesion.   Right coronary artery is small and is nondominant.  There are minor luminal  irregularities.  The  distal right coronary artery supplies a very well-  developed collateral vessel, which feeds up into the proximal LAD.  The LAD  fills fairly nicely from this collateral branch.   The left ventriculogram was performed in a 30 RAO position.  It reveals  overall well preserved left ventricular systolic function.  Ejection  fraction approximately 60%.  There is mild amount of mitral regurgitation.   COMPLICATIONS:  None.   CONCLUSION:  Significant coronary artery disease primarily involving the  left anterior descending artery.  She has a chronic total occlusion of the  left anterior descending with good collateral filling from the right  coronary artery.  She does have evidence of ischemia by Cardiolite scanning.  The lesion is a fairly high risk for angioplasty because  of the significant stenosis that goes all the way up to the left main.  I do  not think that it would be possible to safely put a stent to adequately  cover the lesion without jeopardizing the left main.  We will discuss the  case with CVTS and discuss the case with the patient.  ______________________________  Vesta Mixer, M.D.     PJN/MEDQ  D:  08/23/2005  T:  08/23/2005  Job:  [id number]   cc:   Caryn Bee L. Little, M.D.  Fax: [phone]

## 2010-06-17 NOTE — H&P (Signed)
NAME:  Hayley Long NO.:  0   MEDICAL RECORD NO.:  [id number]           PATIENT TYPE:   LOCATION:                                 FACILITY:   PHYSICIAN:  Vesta Mixer, M.D.      DATE OF BIRTH:   DATE OF ADMISSION:  08/11/2005  DATE OF DISCHARGE:                                HISTORY & PHYSICAL   HISTORY OF PRESENT ILLNESS:  Hayley Long is an elderly female with a  history of hypertension and moderate coronary artery disease.  She recently  has been having some episodes of chest discomfort.  She had a Cardiolite  study which revealed anterior apical ischemia.  She is now referred for  heart catheterization.   Hayley Long has been followed for quite some time.  She had a heart  catheterization approximately 9 years ago.  She was found to have moderate  coronary artery irregularities.  This included a 50% mid LAD stenosis.  She  also had a 50% distal LAD stenosis.  She has overall done fairly well.  She  continues to have intermittent episodes of hypertension.  Recently, she  presented with some episodes of chest discomfort.  These occur very  frequently with exertion.  She states that they typically get better with  rest.  They last for a couple of minutes.  She had a stress Cardiolite study  as noted above and had evidence of anterior wall ischemia.  She was noted to  have normal left ventricular systolic function with an ejection fraction of  61% at that time.   She has a history of hypertension, but this been intermittently fairly well-  controlled.  She states her blood pressure is typically normal when she is  at home, but it goes up a lot when she comes to the doctor.   CURRENT MEDICATIONS:  1.  Aspirin 81 mg a day.  2.  Potassium chloride 10 mEq a day.  3.  Calcium once a day.  4.  Osteo-Bioflex once a day.  5.  Inderal 80 mg a day.  6.  Hydrochlorothiazide 25 mg a day.  7.  Diovan 160 mg a day.  8.  Norvasc 5 mg a day.  9.   Multivitamin once a day.   PAST MEDICAL HISTORY:  1.  Hypertension.  2.  Moderate coronary artery disease.   SOCIAL HISTORY:  The patient is a nonsmoker.   FAMILY HISTORY:  Unremarkable.   REVIEW OF SYSTEMS:  As per HPI and is otherwise unremarkable.   PHYSICAL EXAMINATION:  GENERAL:  She is an elderly female in no acute  distress.  She is alert and oriented x3 and her mood and affect are normal.  Her weight is 179 which is down 7 pounds.  VITAL SIGNS:  Blood pressure is 156/74, heart rate of 52.  HEENT:  Exam reveals 2+ carotids, no bruits, no JVD, no thyromegaly.  LUNGS:  Clear to auscultation.  HEART:  Regular rate S1-S2.  She has no murmurs.  ABDOMEN:  Abdominal exam reveals good bowel sounds and  is nontender.  EXTREMITIES:  She has no clubbing, cyanosis or edema.  Her exam is nonfocal.   LABORATORY DATA AND X-RAY FINDINGS:  Laboratory data is was drawn and is  pending.   ASSESSMENT/PLAN:  Hayley Long presents with chest discomfort.  She is found  to have an abnormal Cardiolite study.  We have scheduled her for heart  catheterization.  We have discussed the risks, benefits and options of heart  catheterization.  She understands and agrees to proceed.  All of her other  medical problems stable.           ______________________________  Vesta Mixer, M.D.     PJN/MEDQ  D:  08/11/2005  T:  08/11/2005  Job:  [id number]   cc:   Caryn Bee L. Little, M.D.  Fax: [phone]

## 2010-06-17 NOTE — Consult Note (Signed)
NAMEMAELEY, MATTON                        ACCOUNT NO.:  [id number]   MEDICAL RECORD NO.:  [id number]                   PATIENT TYPE:  EMS   LOCATION:  [facility]                                 FACILITY:  [facility]   PHYSICIAN:  Vesta Mixer, M.D.              DATE OF BIRTH:  1920-02-02   DATE OF CONSULTATION:  06/04/2002  DATE OF DISCHARGE:                                   CONSULTATION   HISTORY OF PRESENT ILLNESS:  The patient is a 77 year old female with a  history of hypertension, noncompliance and a recent onset of congestive  heart failure.   The patient was last seen in our office in 1998.  At that time she had been  having some chest pain and shortness of breath.  A heart catheterization  revealed minor luminal irregularities.  She was thought to have had possible  previous anterior wall MI because of her abnormal EKG.  She had a relatively  normal left ventricular systolic function and it turned out that she had not  had a myocardial infarction.   She was placed on blood pressure medicines for fairly significant  hypertension at that time but she stopped the medications because she states  that they made her fair fall out. She has not been seen by any doctor over  the past several years.   For the past several weeks she has had progressive shortness of breath with  any sort of exertion.  She also described some pins and needle-like chest  pain.  These episodes of chest pain feel like pins are sticking in her. They  only last for a split second and are not necessary associated with eating,  drinking, change in position, taking a deep breath or exertion.  She was  seen by Dr. Clarene Duke today and was referred to the emergency room.  She was  seen by [place] for further evaluation.   CURRENT MEDICATIONS:  None.   ALLERGIES:  Possible allergy to CODEINE.   PAST MEDICAL HISTORY:  1. Hypertension.  2. New onset congestive heart failure.   HABITS:  The patient is a nonsmoker and  does not drink.   REVIEW OF SYMPTOMS:  The patient complains of shortness of breath for the  past several weeks.  She denies any cough or sputum production.   PHYSICAL EXAMINATION:  GENERAL APPEARANCE:  She is an elderly female in no  acute distress.  VITAL SIGNS:  Blood pressure is 198/108 with heart rate of 62.  NECK:  There are 2+ carotids, she has no bruits.  There is no JVD, no  thyromegaly.  LUNGS:  There are a few bibasilar rales.  CARDIOVASCULAR:  Regular rate, S1 and S2, multiple premature beats.  ABDOMEN:  There are good bowel sounds.  Abdomen is nontender.  She has no  bruits.  EXTREMITIES:  She has no edema.  Her calves are nontender.  NEUROLOGIC:  Examination is  nonfocal.   EKG reveals poor R-wave progression.  She has no ST or T-wave changes.   LABORATORY DATA:  This is basically within normal limits.  A CPK and CPK-MB  are pending.  Electrolytes and CBC are within normal limits.   Her chest x-ray from [facility] reveals very mild  pulmonary edema.   IMPRESSION AND PLAN:  1. Hypertension.  The patient has very poor medical compliance.  She was on     several medications in the past and all of these have been stopped.  We     will start her on Diovan HCT 160 mg/25 mg once a day.  We may have to     decrease the dose of the hydrochlorothiazide for the next several weeks     since we plan on having her on some Lasix.  She will follow up with Dr.     Elease Hashimoto in the office for her hypertension.  2. Congestive heart failure.  The patient has clinical and chest x-ray     evidence of congestive heart failure.  This is most likely due to her     uncontrolled hypertension.  We will start her on Lasix 40 mg a day as     well as potassium chloride 10 mEq a day. We will check an echocardiogram     in the office.  I will see her back in the office in one week.  3. All of her other medical problems remain fairly stable.                                                Vesta Mixer, M.D.    PJN/MEDQ  D:  06/04/2002  T:  06/05/2002  Job:  [id number]   cc:   Caryn Bee L. Little, M.D.  [address]  [place]  [place] [address]  Fax: [phone]

## 2010-07-04 ENCOUNTER — Other Ambulatory Visit: Payer: Self-pay | Admitting: *Deleted

## 2010-07-04 MED ORDER — AMLODIPINE BESYLATE 5 MG PO TABS: 5.0000 mg | ORAL_TABLET | Freq: Every day | ORAL | Status: DC

## 2010-07-04 NOTE — Telephone Encounter (Signed)
Fax received from pharmacy. Refill completed. Jodette Thaer Miyoshi RN  

## 2010-08-17 ENCOUNTER — Other Ambulatory Visit: Payer: Self-pay | Admitting: Family Medicine

## 2010-08-17 DIAGNOSIS — R269 Unspecified abnormalities of gait and mobility: Secondary | ICD-10-CM

## 2010-08-17 DIAGNOSIS — R410 Disorientation, unspecified: Secondary | ICD-10-CM

## 2010-08-18 ENCOUNTER — Ambulatory Visit
Admission: RE | Admit: 2010-08-18 | Discharge: 2010-08-18 | Disposition: A | Discharge status: Home / Self Care | Payer: Medicare Other | Source: Ambulatory Visit | Attending: Family Medicine | Admitting: Family Medicine

## 2010-08-18 DIAGNOSIS — R410 Disorientation, unspecified: Secondary | ICD-10-CM

## 2010-08-18 DIAGNOSIS — R269 Unspecified abnormalities of gait and mobility: Secondary | ICD-10-CM

## 2010-08-18 MED ORDER — IOHEXOL 300 MG/ML  SOLN
75.0000 mL | Freq: Once | INTRAMUSCULAR | Status: AC | PRN
  Administered 2010-08-18: 75 mL via INTRAVENOUS

## 2010-08-22 ENCOUNTER — Other Ambulatory Visit: Payer: Self-pay | Admitting: *Deleted

## 2010-08-22 MED ORDER — HYDROCHLOROTHIAZIDE 25 MG PO TABS: 25.0000 mg | ORAL_TABLET | Freq: Every day | ORAL | Status: DC

## 2010-08-22 NOTE — Telephone Encounter (Signed)
Fax received from pharmacy. Refill completed. Jodette Kerri Kovacik RN  

## 2010-10-27 LAB — PROTIME-INR: INR: 0.9

## 2010-10-27 LAB — CBC
Hemoglobin: 13.7
RDW: 13.6

## 2010-10-27 LAB — CARDIAC PANEL(CRET KIN+CKTOT+MB+TROPI)
CK, MB: 1.3
Relative Index: INVALID
Total CK: 45
Total CK: 67
Troponin I: 0.28 — ABNORMAL HIGH
Troponin I: 0.31 — ABNORMAL HIGH

## 2010-10-27 LAB — COMPREHENSIVE METABOLIC PANEL
ALT: 22
AST: 34
Albumin: 4.2
Alkaline Phosphatase: 50
BUN: 13
Chloride: 98
GFR calc Af Amer: >60
Potassium: 4.2
Sodium: 136
Total Protein: 7.4

## 2010-12-18 ENCOUNTER — Other Ambulatory Visit: Payer: Self-pay | Admitting: Cardiovascular Disease

## 2010-12-29 ENCOUNTER — Other Ambulatory Visit: Payer: Self-pay | Admitting: Cardiovascular Disease

## 2011-02-16 ENCOUNTER — Other Ambulatory Visit: Payer: Self-pay | Admitting: *Deleted

## 2011-02-16 MED ORDER — HYDROCHLOROTHIAZIDE 25 MG PO TABS: 25.0000 mg | ORAL_TABLET | Freq: Every day | ORAL | Status: DC

## 2011-02-16 NOTE — Telephone Encounter (Signed)
Fax Received. Refill Completed. Thomasenia Dowse Chowoe (R.M.A)   

## 2011-04-02 ENCOUNTER — Other Ambulatory Visit: Payer: Self-pay | Admitting: Cardiovascular Disease

## 2011-05-08 ENCOUNTER — Encounter: Payer: Self-pay | Admitting: *Deleted

## 2011-05-18 ENCOUNTER — Other Ambulatory Visit: Payer: Self-pay | Admitting: *Deleted

## 2011-05-18 MED ORDER — HYDROCHLOROTHIAZIDE 25 MG PO TABS: 25.0000 mg | ORAL_TABLET | Freq: Every day | ORAL | Status: DC

## 2011-06-16 ENCOUNTER — Ambulatory Visit (INDEPENDENT_AMBULATORY_CARE_PROVIDER_SITE_OTHER): Payer: Medicare Other | Admitting: Cardiovascular Disease

## 2011-06-16 ENCOUNTER — Encounter: Payer: Self-pay | Admitting: Cardiovascular Disease

## 2011-06-16 VITALS — BP 132/77 | HR 87 | Ht 66.0 in | Wt 133.6 lb

## 2011-06-16 DIAGNOSIS — I4891 Unspecified atrial fibrillation: Secondary | ICD-10-CM

## 2011-06-16 DIAGNOSIS — I10 Essential (primary) hypertension: Secondary | ICD-10-CM | POA: Insufficient documentation

## 2011-06-16 DIAGNOSIS — E785 Hyperlipidemia, unspecified: Secondary | ICD-10-CM | POA: Insufficient documentation

## 2011-06-16 DIAGNOSIS — I2510 Atherosclerotic heart disease of native coronary artery without angina pectoris: Secondary | ICD-10-CM

## 2011-06-16 LAB — BASIC METABOLIC PANEL
BUN: 22 mg/dL (ref 6–23)
CO2: 30 mEq/L (ref 19–32)
Calcium: 9.5 mg/dL (ref 8.4–10.5)
Glucose, Bld: 94 mg/dL (ref 70–99)
Sodium: 137 mEq/L (ref 135–145)

## 2011-06-16 LAB — LIPID PANEL
HDL: 61.6 mg/dL (ref >39.00)
Total CHOL/HDL Ratio: 3
VLDL: 29.8 mg/dL (ref 0.0–40.0)

## 2011-06-16 LAB — HEPATIC FUNCTION PANEL: Total Bilirubin: 0.4 mg/dL (ref 0.3–1.2)

## 2011-06-16 MED ORDER — NITROGLYCERIN 0.4 MG SL SUBL: 0.4000 mg | SUBLINGUAL_TABLET | SUBLINGUAL | Status: DC | PRN

## 2011-06-16 NOTE — Assessment & Plan Note (Signed)
She's been intolerant to multiple medications in the past. We'll go ahead and check her lipids again today.

## 2011-06-16 NOTE — Assessment & Plan Note (Signed)
She is in atrial fibrillation with a controlled ventricular response. She has not been on Coumadin candidate because of her medical noncompliance. We'll continue with her same medications.

## 2011-06-16 NOTE — Progress Notes (Signed)
Rosie Fate Date of Birth  Apr 22, 1920       [facility]    [place] [address]  [address] [place], [place]  [address]   [place], [place]  [address] [phone]     [phone]   Fax  [phone]    Fax [phone]  Problem List: 1. Coronary disease-chronic total occlusion of the LAD- she was offered surgery many years ago but has refused. The LV is not a candidate for PCI. 2. Atrial fibrillation-on Coumadin because of her noncompliance with medications 3. Hypertension 4. Eye surgery  History of Present Illness:  Pt has continued cp on occasion - typically occurs after walking.  Her appetite is slowly declining.  She does not get much exercise. She's very hard of hearing and it was difficult to communicate with her today. She does seem to have some episodes of chest pain. Her son came with her today. They've requested refill on her nitroglycerin.  She has a history of coronary artery disease but has not wanted to have surgery. She's had multiple times that she wants just medical management. In addition, she also refuses lots of medications.  She seems to have reactions to generic medications Aleve largely had her on brand-name medications.   Current Outpatient Prescriptions on File Prior to Visit  Medication Sig Dispense Refill  . aspirin 81 MG tablet Take 81 mg by mouth daily.        [name] CALCIUM PO Take by mouth 2 (two) times daily.        [name] DIOVAN 160 MG tablet TAKE 1 TABLET BY MOUTH EVERY DAY  30 tablet  6  . hydrochlorothiazide (HYDRODIURIL) 25 MG tablet Take 1 tablet (25 mg total) by mouth daily.  30 tablet  2  . KLOR-CON 8 MEQ CR tablet TAKE 1 TABLET BY MOUTH EVERY DAY  30 tablet  5  . Misc Natural Products (OSTEO BI-FLEX JOINT SHIELD PO) Take by mouth 2 (two) times daily.        . Multiple Vitamin (MULTIVITAMIN) tablet Take 1 tablet by mouth daily.        . nitroGLYCERIN (NITROSTAT) 0.4 MG SL tablet Place 0.4 mg under  the tongue every 5 (five) minutes as needed.        . NORVASC 5 MG tablet TAKE 1 TABLET BY MOUTH EVERY DAY  30 tablet  3    Allergies  Allergen Reactions  . Corticosteroids     Past Medical History  Diagnosis Date  . CAD (coronary artery disease)   . Atrial fibrillation   . HTN (hypertension)     Past Surgical History  Procedure Date  . Eye surgery   . Tonsillectomy     History  Smoking status  . Never Smoker   Smokeless tobacco  . Not on file    History  Alcohol Use No    Family History  Problem Relation Age of Onset  . Heart attack    . Diabetes    . Cancer      Reviw of Systems:  Reviewed in the HPI.  All other systems are negative.  Physical Exam: Blood pressure 132/77, pulse 87, height 5\' 6"  (1.676 m), weight 133 lb 9.6 oz (60.601 kg). General: Well developed, well nourished, in no acute distress.  Head: Normocephalic, atraumatic, sclera non-icteric, mucus membranes are moist,   Neck: Supple. Carotids are 2 + without bruits. No JVD  Lungs: Clear bilaterally to auscultation.  Heart: regular rate.  normal  S1 S2. No murmurs, gallops or rubs.  Abdomen: Soft, non-tender, non-distended with normal bowel sounds. No hepatomegaly. No rebound/guarding. No masses.  Msk:  Strength and tone are normal  Extremities: No clubbing or cyanosis. No edema.  Distal pedal pulses are 2+ and equal bilaterally.  Neuro: Alert and oriented X 3. She is very hard of hearing.  Moves all extremities spontaneously.  Psych:  Responds to questions appropriately with a normal affect.  ECG: Jun 16, 2011-atrial fibrillation at a rate of 86 beats per minute. She has left axis deviation. His left bundle branch block.  Assessment / Plan:

## 2011-06-16 NOTE — Patient Instructions (Signed)
Your physician recommends that you return for lab work in: today  Your physician wants you to follow-up in: 1 year. You will receive a reminder letter in the mail two months in advance. If you don't receive a letter, please call our office to schedule the follow-up appointment.   

## 2011-06-16 NOTE — Assessment & Plan Note (Signed)
She has stable coronary artery disease. She refuses surgery many years ago. We'll continue with medical therapy. Her overall health seems to be gradually declining. The chronic total occlusion is not a good candidate for PCI.

## 2011-06-19 ENCOUNTER — Telehealth: Payer: Self-pay | Admitting: *Deleted

## 2011-06-19 DIAGNOSIS — E876 Hypokalemia: Secondary | ICD-10-CM

## 2011-06-19 MED ORDER — POTASSIUM CHLORIDE CRYS ER 10 MEQ PO TBCR: 10.0000 meq | EXTENDED_RELEASE_TABLET | Freq: Every day | ORAL | Status: DC

## 2011-06-19 NOTE — Telephone Encounter (Signed)
Dc 8 mg, new script sent, need repeat lab draw.

## 2011-06-19 NOTE — Telephone Encounter (Signed)
Message copied by Antony Odea on Mon Jun 19, 2011  6:19 PM ------      Message from: Vesta Mixer      Created: Sat Jun 17, 2011  7:14 AM       Cholesterol levels are very good.  Her potassium is low because of her HCTZ .  Start Kdur 10 meq a day.  Recheck in 1 month

## 2011-06-20 ENCOUNTER — Telehealth: Payer: Self-pay | Admitting: Cardiovascular Disease

## 2011-06-20 NOTE — Telephone Encounter (Signed)
Fu call °Pt returning your call  °

## 2011-06-20 NOTE — Telephone Encounter (Signed)
Addended by: Antony Odea on: 06/20/2011 11:31 AM   Modules accepted: Orders

## 2011-06-20 NOTE — Telephone Encounter (Signed)
1 mo app/ lab date given, knows to increase k+ from 8 meq to 10 meq.

## 2011-06-20 NOTE — Telephone Encounter (Signed)
Done see note

## 2011-06-20 NOTE — Telephone Encounter (Signed)
msg left on both numbers, need to return call for medication adjustments, return number provided.

## 2011-07-19 ENCOUNTER — Inpatient Hospital Stay (HOSPITAL_COMMUNITY)
Admission: EM | Admit: 2011-07-19 | Discharge: 2011-07-27 | DRG: 336 | Disposition: A | Discharge status: Home Health | Payer: Medicare Other | Attending: Family Medicine | Admitting: Family Medicine

## 2011-07-19 ENCOUNTER — Emergency Department (HOSPITAL_COMMUNITY): Payer: Medicare Other

## 2011-07-19 ENCOUNTER — Encounter (HOSPITAL_COMMUNITY): Payer: Self-pay | Admitting: *Deleted

## 2011-07-19 DIAGNOSIS — K56609 Unspecified intestinal obstruction, unspecified as to partial versus complete obstruction: Secondary | ICD-10-CM

## 2011-07-19 DIAGNOSIS — Z9089 Acquired absence of other organs: Secondary | ICD-10-CM

## 2011-07-19 DIAGNOSIS — Z79899 Other long term (current) drug therapy: Secondary | ICD-10-CM

## 2011-07-19 DIAGNOSIS — K565 Intestinal adhesions [bands], unspecified as to partial versus complete obstruction: Secondary | ICD-10-CM

## 2011-07-19 DIAGNOSIS — E876 Hypokalemia: Secondary | ICD-10-CM | POA: Diagnosis present

## 2011-07-19 DIAGNOSIS — E785 Hyperlipidemia, unspecified: Secondary | ICD-10-CM

## 2011-07-19 DIAGNOSIS — G5600 Carpal tunnel syndrome, unspecified upper limb: Secondary | ICD-10-CM | POA: Diagnosis present

## 2011-07-19 DIAGNOSIS — I10 Essential (primary) hypertension: Secondary | ICD-10-CM

## 2011-07-19 DIAGNOSIS — K5900 Constipation, unspecified: Secondary | ICD-10-CM | POA: Diagnosis present

## 2011-07-19 DIAGNOSIS — N179 Acute kidney failure, unspecified: Secondary | ICD-10-CM

## 2011-07-19 DIAGNOSIS — N390 Urinary tract infection, site not specified: Secondary | ICD-10-CM

## 2011-07-19 DIAGNOSIS — I4891 Unspecified atrial fibrillation: Secondary | ICD-10-CM

## 2011-07-19 DIAGNOSIS — I509 Heart failure, unspecified: Secondary | ICD-10-CM | POA: Diagnosis present

## 2011-07-19 DIAGNOSIS — E871 Hypo-osmolality and hyponatremia: Secondary | ICD-10-CM | POA: Diagnosis not present

## 2011-07-19 DIAGNOSIS — I959 Hypotension, unspecified: Secondary | ICD-10-CM

## 2011-07-19 DIAGNOSIS — I951 Orthostatic hypotension: Secondary | ICD-10-CM | POA: Diagnosis present

## 2011-07-19 DIAGNOSIS — Z66 Do not resuscitate: Secondary | ICD-10-CM | POA: Diagnosis present

## 2011-07-19 DIAGNOSIS — I2510 Atherosclerotic heart disease of native coronary artery without angina pectoris: Secondary | ICD-10-CM

## 2011-07-19 DIAGNOSIS — D72829 Elevated white blood cell count, unspecified: Secondary | ICD-10-CM | POA: Diagnosis present

## 2011-07-19 HISTORY — DX: Unspecified osteoarthritis, unspecified site: M19.90

## 2011-07-19 HISTORY — DX: Adverse effect of unspecified anesthetic, initial encounter: T41.45XA

## 2011-07-19 HISTORY — DX: Other complications of anesthesia, initial encounter: T88.59XA

## 2011-07-19 HISTORY — DX: Carpal tunnel syndrome, unspecified upper limb: G56.00

## 2011-07-19 LAB — CBC
HCT: 41.6 % (ref 36.0–46.0)
MCV: 88.7 fL (ref 78.0–100.0)
RBC: 4.69 MIL/uL (ref 3.87–5.11)
WBC: 12.2 10*3/uL — ABNORMAL HIGH (ref 4.0–10.5)

## 2011-07-19 LAB — COMPREHENSIVE METABOLIC PANEL
Albumin: 4.2 g/dL (ref 3.5–5.2)
Alkaline Phosphatase: 38 U/L — ABNORMAL LOW (ref 39–117)
BUN: 35 mg/dL — ABNORMAL HIGH (ref 6–23)
Calcium: 10.8 mg/dL — ABNORMAL HIGH (ref 8.4–10.5)
Creatinine, Ser: 1.21 mg/dL — ABNORMAL HIGH (ref 0.50–1.10)
Potassium: 3.5 mEq/L (ref 3.5–5.1)
Total Protein: 7.8 g/dL (ref 6.0–8.3)

## 2011-07-19 LAB — URINALYSIS, ROUTINE W REFLEX MICROSCOPIC
Nitrite: POSITIVE — AB
Specific Gravity, Urine: 1.034 — ABNORMAL HIGH (ref 1.005–1.030)
Urobilinogen, UA: 0.2 mg/dL (ref 0.0–1.0)

## 2011-07-19 LAB — DIFFERENTIAL
Eosinophils Relative: 0 % (ref 0–5)
Lymphocytes Relative: 9 % — ABNORMAL LOW (ref 12–46)
Lymphs Abs: 1.1 10*3/uL (ref 0.7–4.0)
Monocytes Absolute: 0.9 10*3/uL (ref 0.1–1.0)
Monocytes Relative: 7 % (ref 3–12)

## 2011-07-19 LAB — URINE MICROSCOPIC-ADD ON

## 2011-07-19 LAB — PROCALCITONIN: Procalcitonin: <0.1 ng/mL

## 2011-07-19 LAB — LACTIC ACID, PLASMA
Lactic Acid, Venous: 2.7 mmol/L — ABNORMAL HIGH (ref 0.5–2.2)
Lactic Acid, Venous: 2.7 mmol/L — ABNORMAL HIGH (ref 0.5–2.2)

## 2011-07-19 LAB — LIPASE, BLOOD: Lipase: 56 U/L (ref 11–59)

## 2011-07-19 MED ORDER — MORPHINE SULFATE 2 MG/ML IJ SOLN: 0.5000 mg | INTRAMUSCULAR | Status: DC | PRN

## 2011-07-19 MED ORDER — METOPROLOL TARTRATE 1 MG/ML IV SOLN
2.5000 mg | Freq: Four times a day (QID) | INTRAVENOUS | Status: DC
  Filled 2011-07-19 (×5): qty 5

## 2011-07-19 MED ORDER — IOHEXOL 300 MG/ML  SOLN
100.0000 mL | Freq: Once | INTRAMUSCULAR | Status: AC | PRN
  Administered 2011-07-19: 100 mL via INTRAVENOUS

## 2011-07-19 MED ORDER — ASPIRIN 81 MG PO TABS: 81.0000 mg | ORAL_TABLET | Freq: Every day | ORAL | Status: DC

## 2011-07-19 MED ORDER — PIPERACILLIN-TAZOBACTAM 3.375 G IVPB
3.3750 g | Freq: Three times a day (TID) | INTRAVENOUS | Status: DC
  Administered 2011-07-20 – 2011-07-25 (×16): 3.375 g via INTRAVENOUS
  Filled 2011-07-19 (×18): qty 50

## 2011-07-19 MED ORDER — ONDANSETRON HCL 4 MG/2ML IJ SOLN
4.0000 mg | Freq: Once | INTRAMUSCULAR | Status: AC
  Administered 2011-07-19: 4 mg via INTRAVENOUS
  Filled 2011-07-19: qty 2

## 2011-07-19 MED ORDER — SODIUM CHLORIDE 0.9 % IJ SOLN
3.0000 mL | Freq: Two times a day (BID) | INTRAMUSCULAR | Status: DC
  Administered 2011-07-19 – 2011-07-25 (×6): 3 mL via INTRAVENOUS

## 2011-07-19 MED ORDER — ONDANSETRON HCL 4 MG/2ML IJ SOLN: 4.0000 mg | Freq: Three times a day (TID) | INTRAMUSCULAR | Status: AC | PRN

## 2011-07-19 MED ORDER — SODIUM CHLORIDE 0.9 % IV SOLN
INTRAVENOUS | Status: AC
  Administered 2011-07-19: via INTRAVENOUS

## 2011-07-19 MED ORDER — ASPIRIN 81 MG PO CHEW
81.0000 mg | CHEWABLE_TABLET | Freq: Every day | ORAL | Status: DC
  Administered 2011-07-22: 81 mg via ORAL
  Filled 2011-07-19 (×4): qty 1

## 2011-07-19 MED ORDER — LORAZEPAM 2 MG/ML IJ SOLN
1.0000 mg | Freq: Once | INTRAMUSCULAR | Status: AC
  Administered 2011-07-19: 1 mg via INTRAVENOUS
  Filled 2011-07-19: qty 1

## 2011-07-19 MED ORDER — PROMETHAZINE HCL 12.5 MG PO TABS
6.2500 mg | ORAL_TABLET | Freq: Four times a day (QID) | ORAL | Status: DC | PRN
  Filled 2011-07-19: qty 1

## 2011-07-19 MED ORDER — SODIUM CHLORIDE 0.9 % IV SOLN
INTRAVENOUS | Status: DC
  Administered 2011-07-19: 16:00:00 via INTRAVENOUS

## 2011-07-19 MED ORDER — VANCOMYCIN HCL 500 MG IV SOLR
500.0000 mg | INTRAVENOUS | Status: DC
  Administered 2011-07-19: 500 mg via INTRAVENOUS
  Filled 2011-07-19: qty 500

## 2011-07-19 MED ORDER — DEXTROSE 5 % IV SOLN
1.0000 g | Freq: Once | INTRAVENOUS | Status: AC
  Administered 2011-07-19: 1 g via INTRAVENOUS
  Filled 2011-07-19: qty 10

## 2011-07-19 MED ORDER — SODIUM CHLORIDE 0.9 % IV BOLUS (SEPSIS)
500.0000 mL | Freq: Once | INTRAVENOUS | Status: AC
  Administered 2011-07-19: 18:00:00 via INTRAVENOUS

## 2011-07-19 MED ORDER — SODIUM CHLORIDE 0.9 % IV BOLUS (SEPSIS)
500.0000 mL | Freq: Once | INTRAVENOUS | Status: AC
  Administered 2011-07-19: 500 mL via INTRAVENOUS

## 2011-07-19 MED ORDER — LORAZEPAM 2 MG/ML IJ SOLN
0.5000 mg | Freq: Once | INTRAMUSCULAR | Status: AC
  Administered 2011-07-19: 0.5 mg via INTRAVENOUS
  Filled 2011-07-19: qty 1

## 2011-07-19 NOTE — Progress Notes (Signed)
ANTIBIOTIC CONSULT NOTE - INITIAL  Pharmacy Consult for Vancomycin/Zosyn Indication: acute ischemic colitis/leukocytosis  Allergies  Allergen Reactions  . Corticosteroids Swelling    Patient Measurements: Height (as reported on 06/16/11)= 168cm Weight (as reported on 06/16/11)= 60.6 kg   Vital Signs: Temp: 98.5 F (36.9 C) (06/19 1527) Temp src: Oral (06/19 1527) BP: 119/59 mmHg (06/19 1608) Pulse Rate: 116  (06/19 1608)    Intake/Output from this shift: Total I/O In: -  Out: 1000 [Other:1000]  Labs:  Basename 07/19/11 1600  WBC 12.2*  HGB 14.8  PLT 245  LABCREA --  CREATININE 1.21*   No results found for this basename: VANCOTROUGH:2,VANCOPEAK:2,VANCORANDOM:2,GENTTROUGH:2,GENTPEAK:2,GENTRANDOM:2,TOBRATROUGH:2,TOBRAPEAK:2,TOBRARND:2,AMIKACINPEAK:2,AMIKACINTROU:2,AMIKACIN:2, in the last 72 hours   Microbiology: No results found for this or any previous visit (from the past 720 hour(s)).  Medical History: Past Medical History  Diagnosis Date  . CAD (coronary artery disease)   . Atrial fibrillation   . HTN (hypertension)   . Arthritis   . Carpal tunnel syndrome   . Complication of anesthesia     fights coming out of anethesia    Medications:  Scheduled:    . cefTRIAXone (ROCEPHIN)  IV  1 g Intravenous Once  . LORazepam  0.5 mg Intravenous Once  . LORazepam  1 mg Intravenous Once  . ondansetron  4 mg Intravenous Once  . sodium chloride  500 mL Intravenous Once  . sodium chloride  500 mL Intravenous Once   Infusions:    . sodium chloride 125 mL/hr at 07/19/11 1613   PRN: iohexol Assessment: 78 yo patient admitted due to nausea, orthostasis and intractable constipation. Vanco and Zosyn started for leukocytosis/acute ischemic colitis. Scr=1.21 with estimated ClCr=30 ml/min.  Goal of Therapy:  Vancomycin trough level 15-20 mcg/ml  Plan:  . Will start Zosyn 3.375 Gm IV Q8h (4 hr infusion) . Vancomycin 500mg  IV Q24h . Will check Vanco trough level at  steady state or if renal function changes.  Dorethea Clan 07/19/2011,8:08 PM

## 2011-07-19 NOTE — ED Provider Notes (Signed)
History     CSN: [id number]  Arrival date & time 07/19/11  1203   First MD Initiated Contact with Patient 07/19/11 1527      Chief Complaint  Patient presents with  . Constipation  . Dehydration    (Consider location/radiation/quality/duration/timing/severity/associated sxs/prior treatment) HPI Comments: Patient has had ongoing issue with constipation over the past 6 months. She's required use of milk of magnesia. She's had worsening associated abdominal pain and constipation for the past several days. Attempted to use an enema yesterday and had a syncopal event. Denies chest pain, shortness of breath.  Patient is a 78 y.o. female presenting with abdominal pain. The history is provided by the patient. No language interpreter was used.  Abdominal Pain The primary symptoms of the illness include abdominal pain, nausea, vomiting and hematochezia. The primary symptoms of the illness do not include fever, fatigue, shortness of breath, diarrhea, hematemesis, dysuria, vaginal discharge or vaginal bleeding. The current episode started 2 days ago. The onset of the illness was gradual. The problem has been gradually worsening.  The abdominal pain began 2 days ago. The pain came on gradually. The abdominal pain has been gradually worsening since its onset. The abdominal pain is generalized. The abdominal pain does not radiate. The abdominal pain is relieved by nothing.  The hematochezia began more than 1 week ago. The hematochezia has occurred 1 time per day. The hematochezia is a chronic problem.   Risk factors for an acute abdominal problem include being elderly and a history of abdominal surgery. Additional symptoms associated with the illness include anorexia and constipation. Symptoms associated with the illness do not include chills, urgency, frequency or back pain.    Past Medical History  Diagnosis Date  . CAD (coronary artery disease)   . Atrial fibrillation   . HTN (hypertension)   .  Arthritis   . Carpal tunnel syndrome     Past Surgical History  Procedure Date  . Eye surgery   . Tonsillectomy   . Cholecystectomy     Family History  Problem Relation Age of Onset  . Heart attack    . Diabetes    . Cancer      History  Substance Use Topics  . Smoking status: Never Smoker   . Smokeless tobacco: Not on file  . Alcohol Use: No    OB History    Grav Para Term Preterm Abortions TAB SAB Ect Mult Living                  Review of Systems  Constitutional: Negative for fever, chills, activity change, appetite change and fatigue.  HENT: Negative for congestion, sore throat, rhinorrhea, neck pain and neck stiffness.   Respiratory: Negative for cough and shortness of breath.   Cardiovascular: Negative for chest pain and palpitations.  Gastrointestinal: Positive for nausea, vomiting, abdominal pain, constipation, hematochezia and anorexia. Negative for diarrhea and hematemesis.  Genitourinary: Negative for dysuria, urgency, frequency, flank pain, vaginal bleeding and vaginal discharge.  Musculoskeletal: Negative for myalgias, back pain and arthralgias.  Neurological: Positive for syncope (while attempting to have bowel movements) and light-headedness. Negative for dizziness, weakness, numbness and headaches.  All other systems reviewed and are negative.    Allergies  Corticosteroids  Home Medications   Current Outpatient Rx  Name Route Sig Dispense Refill  . AMLODIPINE BESYLATE 5 MG PO TABS Oral Take 5 mg by mouth daily.    [name] CALCIUM CARBONATE 200 MG PO CAPS Oral Take 250 mg by  mouth 2 (two) times daily with a meal.    . HYDROCHLOROTHIAZIDE 25 MG PO TABS Oral Take 25 mg by mouth daily.    Lanetta Inch BI-FLEX JOINT SHIELD PO Oral Take by mouth 2 (two) times daily.      [name] ONE-DAILY MULTI VITAMINS PO TABS Oral Take 1 tablet by mouth daily.      [name] NITROGLYCERIN 0.4 MG SL SUBL Sublingual Place 0.4 mg under the tongue every 5 (five) minutes as needed. Chest pain      . POTASSIUM CHLORIDE CRYS ER 10 MEQ PO TBCR Oral Take 10 mEq by mouth daily.    [name] VALSARTAN 160 MG PO TABS Oral Take 160 mg by mouth daily.    . ASPIRIN 81 MG PO TABS Oral Take 81 mg by mouth daily.        BP 119/59  Pulse 116  Temp 98.5 F (36.9 C) (Oral)  Resp 18  SpO2 96%  Physical Exam  Nursing note and vitals reviewed. Constitutional: She is oriented to person, place, and time. She appears well-developed and well-nourished.  HENT:  Head: Normocephalic and atraumatic.  Mouth/Throat: Oropharynx is clear and moist.  Eyes: Conjunctivae and EOM are normal. Pupils are equal, round, and reactive to light.  Neck: Normal range of motion. Neck supple.  Cardiovascular: Normal rate, normal heart sounds and intact distal pulses.  Exam reveals no gallop and no friction rub.   No murmur heard.      irreg irreg rhythm  Pulmonary/Chest: Effort normal and breath sounds normal. No respiratory distress. She exhibits no tenderness.  Abdominal: Soft. She exhibits distension. There is tenderness. There is no rebound and no guarding.  Genitourinary: Rectal exam shows external hemorrhoid. Rectal exam shows no tenderness. Guaiac negative stool.       Unable to palpate stool on card  Musculoskeletal: Normal range of motion. She exhibits no edema and no tenderness.  Neurological: She is alert and oriented to person, place, and time. She has normal strength. No cranial nerve deficit or sensory deficit.  Skin: Skin is warm and dry. No rash noted.    ED Course  Procedures (including critical care time)   Date: 07/19/2011  Rate: 111  Rhythm: atrial fibrillation  QRS Axis: left  Intervals: normal  ST/T Wave abnormalities: normal  Conduction Disutrbances:left bundle branch block  Narrative Interpretation:   Old EKG Reviewed: unchanged  Labs Reviewed  CBC - Abnormal; Notable for the following:    WBC 12.2 (*)     All other components within normal limits  DIFFERENTIAL - Abnormal; Notable for the  following:    Neutrophils Relative 84 (*)     Neutro Abs 10.2 (*)     Lymphocytes Relative 9 (*)     All other components within normal limits  URINALYSIS, ROUTINE W REFLEX MICROSCOPIC - Abnormal; Notable for the following:    Color, Urine AMBER (*)  BIOCHEMICALS MAY BE AFFECTED BY COLOR   APPearance CLOUDY (*)     Specific Gravity, Urine 1.034 (*)     Bilirubin Urine SMALL (*)     Ketones, ur TRACE (*)     Protein, ur 30 (*)     Nitrite POSITIVE (*)     Leukocytes, UA MODERATE (*)     All other components within normal limits  COMPREHENSIVE METABOLIC PANEL - Abnormal; Notable for the following:    Sodium 131 (*)     Chloride 88 (*)     Glucose, Bld 133 (*)  BUN 35 (*)     Creatinine, Ser 1.21 (*)     Calcium 10.8 (*)     Alkaline Phosphatase 38 (*)     GFR calc non Af Amer 39 (*)     GFR calc Af Amer 45 (*)     All other components within normal limits  LACTIC ACID, PLASMA - Abnormal; Notable for the following:    Lactic Acid, Venous 2.7 (*)     All other components within normal limits  URINE MICROSCOPIC-ADD ON - Abnormal; Notable for the following:    Squamous Epithelial / LPF FEW (*)     Bacteria, UA MANY (*)     All other components within normal limits  LIPASE, BLOOD   Ct Abdomen Pelvis W Contrast  07/19/2011  *RADIOLOGY REPORT*  Clinical Data: Abdominal pain and nausea.  Chronic constipation.  CT ABDOMEN AND PELVIS WITH CONTRAST  Technique:  Multidetector CT imaging of the abdomen and pelvis was performed following the standard protocol during bolus administration of intravenous contrast.  Contrast: OMNIPAQUE IOHEXOL 300 MG/ML  SOLN  Comparison: None.  Findings: Distended stomach and moderately dilated small bowel loops are seen, with transition point in the central pelvis. Nondilated distal small bowel is seen distal to this transition point.  No soft tissue mass or inflammatory process identified. This is consistent with a high-grade small bowel obstruction,  likely due to adhesion.  Bilateral renal cysts are noted but there is no evidence of renal mass or hydronephrosis.  The pancreas, spleen, adrenal glands, and liver are normal in appearance.  Prior cholecystectomy noted.  The uterus and adnexa are unremarkable.  Moderate to large colonic stool burden noted, without significant colonic dilatation.  No focal inflammatory process or abnormal fluid collections are identified.  Foley catheter is seen within the bladder, which is nearly empty.  IMPRESSION:  1.  High-grade distal small bowel obstruction, with transition point in the central pelvis, most likely due to an adhesion. 2.  No mass or inflammatory process identified.  Original Report Authenticated By: Danae Orleans, M.D.   Dg Abd Acute W/chest  07/19/2011  *RADIOLOGY REPORT*  Clinical Data: Nausea and vomiting for 1 day.  ACUTE ABDOMEN SERIES (ABDOMEN 2 VIEW & CHEST 1 VIEW)  Comparison: 11/16/2003.  Findings: There is moderate enlargement of the cardiac silhouette. Ectasia and nonaneurysmal calcification of the thoracic aorta are seen.  No pulmonary infiltrates or nodules were evident.  Hilar and mediastinal contours appear stable. No pleural abnormality is evident.  There is slight thoracic scoliosis convexity to the right.  There is degenerative spondylosis.  No pneumoperitoneum is evident on the left side down decubitus abdominal examination.  There is dilatation of multiple loops of small intestine with some air fluid levels on decubitus examination.  There is moderate gaseous and fluid distention of the transverse colon.  Rectal gas is identified.  No opaque calculi are seen.  Pelvic phleboliths are present.  There is osteophyte formation in the spine.  There is a very dense appearance of the L5 vertebral body.  This could reflect degenerative sclerosis but I cannot exclude blastic lesion.  Does the patient have low back pain?  IMPRESSION: Moderate cardiac silhouette enlargement.  No pulmonary edema,  pneumonia, or pleural effusion.  Scoliosis.  Degenerative spondylosis.  No evidence of pneumoperitoneum.  There is dilatation of multiple loops of small intestine with some air fluid levels on decubitus examination.  There is moderate gaseous and fluid distention of the transverse colon.  Rectal gas is identified. There may be an element of ileus.  The small bowel dilatation is out of proportion to the colonic dilatation and I am concerned that there may be an element of partial small-bowel obstruction.  There is a very dense appearance of the L5 vertebral body.  This could reflect degenerative sclerosis but I cannot exclude blastic lesion.  Does the patient have low back pain?  CT of abdomen and pelvis is scheduled to be performed.  Original Report Authenticated By: Crawford Givens, M.D.     1. SBO (small bowel obstruction)   2. AKI (acute kidney injury)   3. Atrial fibrillation   4. UTI (lower urinary tract infection)       MDM  Small bowel obstruction. Discussed with general surgery. Will admit medically as she has multiple comorbidities. Also with urinary tract infection. Placed on Rocephin. Found to have dehydration with bump in her creatinine and a slightly elevated lactate level. No concern about ischemic bowel. Unable to palpate a stool impaction. An NG tube will be placed. Discussed with the [facility] accepted the patient for admission. She is in atrial fibrillation which is her baseline. No evidence of RVR.  This is likely secondary to adhesions        Dayton Bailiff, MD 07/19/11 1839

## 2011-07-19 NOTE — H&P (Addendum)
History and Physical  AMEENA VESEY ZOX:[id number] DOB: October 08, 1920 DOA: 07/19/2011  Referring physician: PCP: Mickie Hillier, MD   Chief Complaint: Nausea Orthostasis with intractable consitpation  HPI:  H/o taken from Son.  78 yr old female started to have constipation about 2 days ago. And trialed enemas and then had some syncopal episodes-6/17.  Has had consitapttion issues in the past .  Assosc with significant abdominal pain, which started with the consitpation and nausea has started with green emesis which started about 12-18 hours ago.  Usually uses laxative after GB surgery thatshe had in the past.   Probably moire than a dozen episdoes of vomitus, which was green-seemed to have had problem swith this overnight.  Has never had this issue before.  Usually has always been able to take care of this with a laxative.-no stool with the enema He rmobility seems to have chronically slowed down. Went to see he rPCP Caryn Bee Little this am due to the abdominla pain and othe rissues. Has also lost some weight over 6 mo-1.5 years  Chart Review:  Followed chronically by cardiology Dr. Elease Hashimoto since 1998  CHF  Hypertension  History of noncompliance with medical therapy  Acute cholecystitis status post laparoscopic cholecystectomy 11/16/2003, Dr. Zachery Dakins  Status post cardiac catheterization 1998  Cardiac cath 08/11/2005 = LAD = 80% stenosis at origin, chronic total occlusion LAD with good collateral refill right coronary, ischemia notable for Cardiolite scanning-was high risk for angioplasty and PCI was not performed  Repeat cardiac cath 04/12/2007 = occluded LAD with collateral filling from right coronary artery-stable from prior Cath  History of chronic bradycardia, syncopal noted to 07/26/2007  History chronic pain syndrome  Review of Systems:  Somewhat limited given patient just received Ativan for placement of NG tube-states she is positive for headache, positive for blurred  vision, positive for chest pain, positive for abdominal pain. Review of systems is also limited given the patient's extreme difficulty hearing  Past Medical History  Diagnosis Date  . CAD (coronary artery disease)   . Atrial fibrillation   . HTN (hypertension)   . Arthritis   . Carpal tunnel syndrome     Past Surgical History  Procedure Date  . Eye surgery   . Tonsillectomy   . Cholecystectomy     Social History:  reports that she has never smoked. She does not have any smokeless tobacco history on file. She reports that she does not drink alcohol or use illicit drugs.  Allergies  Allergen Reactions  . Corticosteroids Swelling    Family History  Problem Relation Age of Onset  . Heart attack    . Diabetes    . Cancer       Prior to Admission medications   Medication Sig Start Date End Date Taking? Authorizing Provider  amLODipine (NORVASC) 5 MG tablet Take 5 mg by mouth daily.   Yes Historical Provider, MD  calcium carbonate 200 MG capsule Take 250 mg by mouth 2 (two) times daily with a meal.   Yes Historical Provider, MD  hydrochlorothiazide (HYDRODIURIL) 25 MG tablet Take 25 mg by mouth daily. 05/18/11 05/17/12 Yes Vesta Mixer, MD  Misc Natural Products (OSTEO BI-FLEX JOINT SHIELD PO) Take by mouth 2 (two) times daily.     Yes Historical Provider, MD  Multiple Vitamin (MULTIVITAMIN) tablet Take 1 tablet by mouth daily.     Yes Historical Provider, MD  nitroGLYCERIN (NITROSTAT) 0.4 MG SL tablet Place 0.4 mg under the tongue every 5 (five) minutes  as needed. Chest pain 06/16/11  Yes Vesta Mixer, MD  potassium chloride (K-DUR,KLOR-CON) 10 MEQ tablet Take 10 mEq by mouth daily. 06/19/11 06/18/12 Yes Vesta Mixer, MD  valsartan (DIOVAN) 160 MG tablet Take 160 mg by mouth daily.   Yes Historical Provider, MD  aspirin 81 MG tablet Take 81 mg by mouth daily.      Historical Provider, MD   Physical Exam: Filed Vitals:   07/19/11 1527 07/19/11 1603 07/19/11 1607 07/19/11  1608  BP: 118/76 126/77 129/100 119/59  Pulse: 101 99 115 116  Temp: 98.5 F (36.9 C)     TempSrc: Oral     Resp: 18     SpO2: 96%        General:  Alert oriented Caucasian female no apparent distress  Eyes: Arcus senilis no pallor no icterus  ENT: Poor dentition back of mouth not seen  Neck: Soft supple no thyromegaly  Cardiovascular: S1-S2 irregularly irregular. On telemetry sinus tach up to 110  Respiratory: Clinically clear no added sound no tactile vocal resonance and fremitus  Abdomen: Quiet, no bowel sounds heard, no rebound or guarding,  Skin: Lower extremities have no pitting edema  Musculoskeletal: Patient has ganglion cysts on her right hand and fingers with multiple osteoarthritic nodules  Psychiatric: Patient seems appropriate and euthymic  Neurologic: Grossly intact  Labs on Admission:  Basic Metabolic Panel:  Lab 07/19/11 1610  NA 131*  K 3.5  CL 88*  CO2 28  GLUCOSE 133*  BUN 35*  CREATININE 1.21*  CALCIUM 10.8*  MG --  PHOS --    Liver Function Tests:  Lab 07/19/11 1600  AST 26  ALT 10  ALKPHOS 38*  BILITOT 0.9  PROT 7.8  ALBUMIN 4.2    Lab 07/19/11 1600  LIPASE 56  AMYLASE --   No results found for this basename: AMMONIA:5 in the last 168 hours  CBC:  Lab 07/19/11 1600  WBC 12.2*  NEUTROABS 10.2*  HGB 14.8  HCT 41.6  MCV 88.7  PLT 245    Cardiac Enzymes: No results found for this basename: CKTOTAL:5,CKMB:5,CKMBINDEX:5,TROPONINI:5 in the last 168 hours  Troponin (Point of Care Test) No results found for this basename: TROPIPOC in the last 72 hours  BNP (last 3 results) No results found for this basename: PROBNP:3 in the last 8760 hours  CBG: No results found for this basename: GLUCAP:5 in the last 168 hours   Radiological Exams on Admission: Ct Abdomen Pelvis W Contrast  07/19/2011  *RADIOLOGY REPORT*  Clinical Data: Abdominal pain and nausea.  Chronic constipation.  CT ABDOMEN AND PELVIS WITH CONTRAST   Technique:  Multidetector CT imaging of the abdomen and pelvis was performed following the standard protocol during bolus administration of intravenous contrast.  Contrast: OMNIPAQUE IOHEXOL 300 MG/ML  SOLN  Comparison: None.  Findings: Distended stomach and moderately dilated small bowel loops are seen, with transition point in the central pelvis. Nondilated distal small bowel is seen distal to this transition point.  No soft tissue mass or inflammatory process identified. This is consistent with a high-grade small bowel obstruction, likely due to adhesion.  Bilateral renal cysts are noted but there is no evidence of renal mass or hydronephrosis.  The pancreas, spleen, adrenal glands, and liver are normal in appearance.  Prior cholecystectomy noted.  The uterus and adnexa are unremarkable.  Moderate to large colonic stool burden noted, without significant colonic dilatation.  No focal inflammatory process or abnormal fluid collections are identified.  Foley catheter is seen within the bladder, which is nearly empty.  IMPRESSION:  1.  High-grade distal small bowel obstruction, with transition point in the central pelvis, most likely due to an adhesion. 2.  No mass or inflammatory process identified.  Original Report Authenticated By: Danae Orleans, M.D.   Dg Abd Acute W/chest  07/19/2011  *RADIOLOGY REPORT*  Clinical Data: Nausea and vomiting for 1 day.  ACUTE ABDOMEN SERIES (ABDOMEN 2 VIEW & CHEST 1 VIEW)  Comparison: 11/16/2003.  Findings: There is moderate enlargement of the cardiac silhouette. Ectasia and nonaneurysmal calcification of the thoracic aorta are seen.  No pulmonary infiltrates or nodules were evident.  Hilar and mediastinal contours appear stable. No pleural abnormality is evident.  There is slight thoracic scoliosis convexity to the right.  There is degenerative spondylosis.  No pneumoperitoneum is evident on the left side down decubitus abdominal examination.  There is dilatation of  multiple loops of small intestine with some air fluid levels on decubitus examination.  There is moderate gaseous and fluid distention of the transverse colon.  Rectal gas is identified.  No opaque calculi are seen.  Pelvic phleboliths are present.  There is osteophyte formation in the spine.  There is a very dense appearance of the L5 vertebral body.  This could reflect degenerative sclerosis but I cannot exclude blastic lesion.  Does the patient have low back pain?  IMPRESSION: Moderate cardiac silhouette enlargement.  No pulmonary edema, pneumonia, or pleural effusion.  Scoliosis.  Degenerative spondylosis.  No evidence of pneumoperitoneum.  There is dilatation of multiple loops of small intestine with some air fluid levels on decubitus examination.  There is moderate gaseous and fluid distention of the transverse colon.  Rectal gas is identified. There may be an element of ileus.  The small bowel dilatation is out of proportion to the colonic dilatation and I am concerned that there may be an element of partial small-bowel obstruction.  There is a very dense appearance of the L5 vertebral body.  This could reflect degenerative sclerosis but I cannot exclude blastic lesion.  Does the patient have low back pain?  CT of abdomen and pelvis is scheduled to be performed.  Original Report Authenticated By: Crawford Givens, M.D.    EKG: Independently reviewed. EKG shows H. of fibrillation at a rate of 107 PR interval not assessable Axis -50 = left anterior fascicular block, no ST-T wave changes from prior EKG done 03/03/2008   Principal Problem:  *SBO (small bowel obstruction) Active Problems:  Coronary artery disease  Atrial fibrillation  Hyperlipidemia  Hypertension  AKI (acute kidney injury)    Assessment/Plan 1. High-grade Small bowel obstruction-patient will be kept n.p.o.  Dr. Biagio Quint has already been consulted from general surgery. Patient likely will be conservatively managed however will need  cardiology clearance prior to surgery-she is not on Coumadin and I suspect if she needs surgery can be held off aspirin for 3-4 days. Further management as per general surgeon.  An NG tube was attempted x2 in the emergency room and will need to be placed given her continued emesis--follow acute abdominal series ordered for tomorrow morning for comparison sake--need serial abdominal exams, given high-grade nature start empiric antibiotics.  See #7 for further details 2. A fibrillation not on Coumadin-patient be kept on cardiac monitoring with telemetry-she was orthostatic at PCPs office likely secondary to continued HCTZ 25 mg use, Diovan 160 mg use, amlodipine 5 mg use and blood pressure medications will be held. I do not  see that she is on a beta blocker or calcium channel blocker however we will place retropleural IV 5 mg every 6 when necessary for heart rate or blood pressure above 120, 160/100 respectively--would continue perioperative aspirin given latest ACP guidelines 3. Hypotension-this is orthostatic and likely multifactorial secondary to continued blood pressure medication use with poor by mouth oral intake-get orthostatics in the morning, continue IV fluids 75 cc an hour 4. Coronary artery disease without intervention in the past 9Reported chest pain-cycle cardiac enzymes x3. 5. Kidney injury-likely secondary to blood pressure use and poor by mouth intake. Patient bolused IV fluids 1 L emergency room and To 1 pack 25 cc an hour which has been changed to 75 cc an hour, given CAD history. 6. Leukocytosis-probably secondary to pronounced vomiting and nausea, however will repeat CBC in the morning given the differential in her case could be an acute ischemic colitis because she does have a fibrillation and is not on warfarin or any significant blood thinners other than ASA-her lactic acid was 2.7 and I would try and this is well. If it increases this with certainly dictate closer surgical  management-empirically place her on vancomycin and Zosyn which can be discontinued if SBO results 7. Hyponatremia-2/2 poor by mouth intake likely as well as decreased by mouth at oral intake other than fluids 8. Possible dementia-hard to ascertain given patient has received Ativan. Reassessment in a.m. needed  Code Status: DNR but if need be would want surgery--no aggressive ressucitation Family Communication: Son--Phillip Arbutus Ped, healthcare power of attorney  And sister in room [phone] Disposition Plan: Inpatient  Pleas Koch, MD  [facility] Pager [phone]  If 8PM-8AM, please contact floor/night-coverage at [email or web address], password [email or web address] 07/19/2011, 6:34 PM

## 2011-07-19 NOTE — ED Notes (Signed)
Pt son reports the patient has a history of chronic constipation and requires a daily laxative in order to stool. Pt last normal BM was a week ago.  Pt attempted to use two enemas on Sunday and had a syncopal episode after trying to stool. Pt was unable to have a BM. Pt has decreased PO intake as eating and drinking makes her nauseated and causes pain in her abdomen.  Pt also reports lower bilateral abdominal pain and fullness sensation.  Pt denies vomiting. Pt son denies history of constipation being this severe before this.

## 2011-07-19 NOTE — Consult Note (Signed)
Reason for Consult:sbo Referring Physician: Dr. Wilhelmenia Blase is an 78 y.o. female.  HPI: 78yo wf presents with 1 week history of constipation. She has had this problem for a long time. Family states she has not had a bm for about a week. They are not sure when she last passed flatus. She has been vomiting today. CT shows sbo. U/a shows uti.  Past Medical History  Diagnosis Date  . CAD (coronary artery disease)   . Atrial fibrillation   . HTN (hypertension)   . Arthritis   . Carpal tunnel syndrome   . Complication of anesthesia     fights coming out of anethesia    Past Surgical History  Procedure Date  . Eye surgery   . Tonsillectomy   . Cholecystectomy   . Inner ear surgery     bilateral ear surgery - titanium in ears    Family History  Problem Relation Age of Onset  . Heart attack    . Diabetes    . Cancer      Social History:  reports that she has never smoked. She has never used smokeless tobacco. She reports that she does not drink alcohol or use illicit drugs.  Allergies:  Allergies  Allergen Reactions  . Corticosteroids Swelling    Medications: I have reviewed the patient's current medications.  Results for orders placed during the hospital encounter of 07/19/11 (from the past 48 hour(s))  CBC     Status: Abnormal   Collection Time   07/19/11  4:00 PM      Component Value Range Comment   WBC 12.2 (*) 4.0 - 10.5 K/uL    RBC 4.69  3.87 - 5.11 MIL/uL    Hemoglobin 14.8  12.0 - 15.0 g/dL    HCT 40.9  81.1 - 91.4 %    MCV 88.7  78.0 - 100.0 fL    MCH 31.6  26.0 - 34.0 pg    MCHC 35.6  30.0 - 36.0 g/dL    RDW 78.2  95.6 - 21.3 %    Platelets 245  150 - 400 K/uL   DIFFERENTIAL     Status: Abnormal   Collection Time   07/19/11  4:00 PM      Component Value Range Comment   Neutrophils Relative 84 (*) 43 - 77 %    Neutro Abs 10.2 (*) 1.7 - 7.7 K/uL    Lymphocytes Relative 9 (*) 12 - 46 %    Lymphs Abs 1.1  0.7 - 4.0 K/uL    Monocytes Relative 7  3  - 12 %    Monocytes Absolute 0.9  0.1 - 1.0 K/uL    Eosinophils Relative 0  0 - 5 %    Eosinophils Absolute 0.0  0.0 - 0.7 K/uL    Basophils Relative 0  0 - 1 %    Basophils Absolute 0.0  0.0 - 0.1 K/uL   COMPREHENSIVE METABOLIC PANEL     Status: Abnormal   Collection Time   07/19/11  4:00 PM      Component Value Range Comment   Sodium 131 (*) 135 - 145 mEq/L    Potassium 3.5  3.5 - 5.1 mEq/L    Chloride 88 (*) 96 - 112 mEq/L    CO2 28  19 - 32 mEq/L    Glucose, Bld 133 (*) 70 - 99 mg/dL    BUN 35 (*) 6 - 23 mg/dL    Creatinine, Ser 0.86 (*)  0.50 - 1.10 mg/dL    Calcium 81.1 (*) 8.4 - 10.5 mg/dL    Total Protein 7.8  6.0 - 8.3 g/dL    Albumin 4.2  3.5 - 5.2 g/dL    AST 26  0 - 37 U/L    ALT 10  0 - 35 U/L    Alkaline Phosphatase 38 (*) 39 - 117 U/L    Total Bilirubin 0.9  0.3 - 1.2 mg/dL    GFR calc non Af Amer 39 (*) >90 mL/min    GFR calc Af Amer 45 (*) >90 mL/min   LIPASE, BLOOD     Status: Normal   Collection Time   07/19/11  4:00 PM      Component Value Range Comment   Lipase 56  11 - 59 U/L   LACTIC ACID, PLASMA     Status: Abnormal   Collection Time   07/19/11  4:00 PM      Component Value Range Comment   Lactic Acid, Venous 2.7 (*) 0.5 - 2.2 mmol/L   URINALYSIS, ROUTINE W REFLEX MICROSCOPIC     Status: Abnormal   Collection Time   07/19/11  5:21 PM      Component Value Range Comment   Color, Urine AMBER (*) YELLOW BIOCHEMICALS MAY BE AFFECTED BY COLOR   APPearance CLOUDY (*) CLEAR    Specific Gravity, Urine 1.034 (*) 1.005 - 1.030    pH 5.5  5.0 - 8.0    Glucose, UA NEGATIVE  NEGATIVE mg/dL    Hgb urine dipstick NEGATIVE  NEGATIVE    Bilirubin Urine SMALL (*) NEGATIVE    Ketones, ur TRACE (*) NEGATIVE mg/dL    Protein, ur 30 (*) NEGATIVE mg/dL    Urobilinogen, UA 0.2  0.0 - 1.0 mg/dL    Nitrite POSITIVE (*) NEGATIVE    Leukocytes, UA MODERATE (*) NEGATIVE   URINE MICROSCOPIC-ADD ON     Status: Abnormal   Collection Time   07/19/11  5:21 PM      Component  Value Range Comment   Squamous Epithelial / LPF FEW (*) RARE    WBC, UA 11-20  <3 WBC/hpf    Bacteria, UA MANY (*) RARE    Urine-Other MUCOUS PRESENT       Ct Abdomen Pelvis W Contrast  07/19/2011  *RADIOLOGY REPORT*  Clinical Data: Abdominal pain and nausea.  Chronic constipation.  CT ABDOMEN AND PELVIS WITH CONTRAST  Technique:  Multidetector CT imaging of the abdomen and pelvis was performed following the standard protocol during bolus administration of intravenous contrast.  Contrast: OMNIPAQUE IOHEXOL 300 MG/ML  SOLN  Comparison: None.  Findings: Distended stomach and moderately dilated small bowel loops are seen, with transition point in the central pelvis. Nondilated distal small bowel is seen distal to this transition point.  No soft tissue mass or inflammatory process identified. This is consistent with a high-grade small bowel obstruction, likely due to adhesion.  Bilateral renal cysts are noted but there is no evidence of renal mass or hydronephrosis.  The pancreas, spleen, adrenal glands, and liver are normal in appearance.  Prior cholecystectomy noted.  The uterus and adnexa are unremarkable.  Moderate to large colonic stool burden noted, without significant colonic dilatation.  No focal inflammatory process or abnormal fluid collections are identified.  Foley catheter is seen within the bladder, which is nearly empty.  IMPRESSION:  1.  High-grade distal small bowel obstruction, with transition point in the central pelvis, most likely due to an adhesion.  2.  No mass or inflammatory process identified.  Original Report Authenticated By: Danae Orleans, M.D.   Dg Abd Acute W/chest  07/19/2011  *RADIOLOGY REPORT*  Clinical Data: Nausea and vomiting for 1 day.  ACUTE ABDOMEN SERIES (ABDOMEN 2 VIEW & CHEST 1 VIEW)  Comparison: 11/16/2003.  Findings: There is moderate enlargement of the cardiac silhouette. Ectasia and nonaneurysmal calcification of the thoracic aorta are seen.  No pulmonary  infiltrates or nodules were evident.  Hilar and mediastinal contours appear stable. No pleural abnormality is evident.  There is slight thoracic scoliosis convexity to the right.  There is degenerative spondylosis.  No pneumoperitoneum is evident on the left side down decubitus abdominal examination.  There is dilatation of multiple loops of small intestine with some air fluid levels on decubitus examination.  There is moderate gaseous and fluid distention of the transverse colon.  Rectal gas is identified.  No opaque calculi are seen.  Pelvic phleboliths are present.  There is osteophyte formation in the spine.  There is a very dense appearance of the L5 vertebral body.  This could reflect degenerative sclerosis but I cannot exclude blastic lesion.  Does the patient have low back pain?  IMPRESSION: Moderate cardiac silhouette enlargement.  No pulmonary edema, pneumonia, or pleural effusion.  Scoliosis.  Degenerative spondylosis.  No evidence of pneumoperitoneum.  There is dilatation of multiple loops of small intestine with some air fluid levels on decubitus examination.  There is moderate gaseous and fluid distention of the transverse colon.  Rectal gas is identified. There may be an element of ileus.  The small bowel dilatation is out of proportion to the colonic dilatation and I am concerned that there may be an element of partial small-bowel obstruction.  There is a very dense appearance of the L5 vertebral body.  This could reflect degenerative sclerosis but I cannot exclude blastic lesion.  Does the patient have low back pain?  CT of abdomen and pelvis is scheduled to be performed.  Original Report Authenticated By: Crawford Givens, M.D.    Review of Systems  Constitutional: Negative.   HENT: Negative.   Eyes: Negative.   Respiratory: Negative.   Cardiovascular: Negative.   Gastrointestinal: Positive for nausea, vomiting, abdominal pain and constipation.  Genitourinary: Negative.   Musculoskeletal:  Negative.   Skin: Negative.   Neurological: Negative.   Endo/Heme/Allergies: Negative.   Psychiatric/Behavioral: Positive for depression.   Blood pressure 119/59, pulse 116, temperature 98.5 F (36.9 C), temperature source Oral, resp. rate 18, SpO2 96.00%. Physical Exam  Constitutional: She is oriented to person, place, and time. She appears well-developed and well-nourished.  HENT:  Head: Normocephalic and atraumatic.  Eyes: Conjunctivae and EOM are normal. Pupils are equal, round, and reactive to light.  Neck: Normal range of motion. Neck supple.  Cardiovascular:       Irregular rate and rhythm  Respiratory: Effort normal and breath sounds normal.  GI: Soft.       Moderate distension but soft with no guarding or peritonitis. No obvious hernia. Few bs  Musculoskeletal: Normal range of motion.  Neurological: She is alert and oriented to person, place, and time.  Skin: Skin is warm and dry.  Psychiatric: She has a normal mood and affect. Her behavior is normal.    Assessment/Plan: SBO vs ileus in an elderly pt with significant cardiac comorbidity. She also has uti. Agree with medical admit. Would hydrate and continue ng and bowel rest. Treat uti and correct lytes. We will follow closely  with you. Repeat abd xrays in am  TOTH III,Verbon Giangregorio S 07/19/2011, 8:44 PM

## 2011-07-19 NOTE — ED Notes (Signed)
Pt here from PCP with son. Reports pt seen for ongoing constipation and dehydration. Pt used enema 2 nights ago, reports she "passed out" after. Reports rebound tenderness on lower abd, per PCP. Orthostatic vitals assessed, dehydration determined, pcp sent pt to ED for treatment.

## 2011-07-20 ENCOUNTER — Inpatient Hospital Stay (HOSPITAL_COMMUNITY): Payer: Medicare Other

## 2011-07-20 ENCOUNTER — Telehealth: Payer: Self-pay | Admitting: Cardiovascular Disease

## 2011-07-20 DIAGNOSIS — I4891 Unspecified atrial fibrillation: Secondary | ICD-10-CM

## 2011-07-20 DIAGNOSIS — K565 Intestinal adhesions [bands], unspecified as to partial versus complete obstruction: Secondary | ICD-10-CM

## 2011-07-20 DIAGNOSIS — N390 Urinary tract infection, site not specified: Secondary | ICD-10-CM | POA: Diagnosis present

## 2011-07-20 DIAGNOSIS — E876 Hypokalemia: Secondary | ICD-10-CM | POA: Diagnosis present

## 2011-07-20 DIAGNOSIS — E782 Mixed hyperlipidemia: Secondary | ICD-10-CM

## 2011-07-20 DIAGNOSIS — N179 Acute kidney failure, unspecified: Secondary | ICD-10-CM

## 2011-07-20 LAB — COMPREHENSIVE METABOLIC PANEL
AST: 19 U/L (ref 0–37)
Albumin: 3.3 g/dL — ABNORMAL LOW (ref 3.5–5.2)
Alkaline Phosphatase: 28 U/L — ABNORMAL LOW (ref 39–117)
BUN: 27 mg/dL — ABNORMAL HIGH (ref 6–23)
Chloride: 96 mEq/L (ref 96–112)
Creatinine, Ser: 0.83 mg/dL (ref 0.50–1.10)
Potassium: 3 mEq/L — ABNORMAL LOW (ref 3.5–5.1)
Total Protein: 6.1 g/dL (ref 6.0–8.3)

## 2011-07-20 LAB — CBC
HCT: 36 % (ref 36.0–46.0)
MCH: 31.3 pg (ref 26.0–34.0)
MCV: 88.7 fL (ref 78.0–100.0)
RBC: 4.06 MIL/uL (ref 3.87–5.11)
WBC: 7.6 10*3/uL (ref 4.0–10.5)

## 2011-07-20 LAB — GLUCOSE, CAPILLARY: Glucose-Capillary: 91 mg/dL (ref 70–99)

## 2011-07-20 MED ORDER — KCL IN DEXTROSE-NACL 20-5-0.9 MEQ/L-%-% IV SOLN
INTRAVENOUS | Status: AC
  Administered 2011-07-20: 75 mL/h via INTRAVENOUS
  Administered 2011-07-21: 02:00:00 via INTRAVENOUS
  Filled 2011-07-20 (×2): qty 1000

## 2011-07-20 MED ORDER — HYDRALAZINE HCL 20 MG/ML IJ SOLN: 5.0000 mg | Freq: Three times a day (TID) | INTRAMUSCULAR | Status: DC | PRN

## 2011-07-20 MED ORDER — PANTOPRAZOLE SODIUM 40 MG IV SOLR
40.0000 mg | INTRAVENOUS | Status: DC
  Administered 2011-07-20 – 2011-07-26 (×6): 40 mg via INTRAVENOUS
  Filled 2011-07-20 (×9): qty 40

## 2011-07-20 MED ORDER — POTASSIUM CHLORIDE 10 MEQ/100ML IV SOLN
10.0000 meq | INTRAVENOUS | Status: AC
  Administered 2011-07-20 (×4): 10 meq via INTRAVENOUS
  Filled 2011-07-20 (×4): qty 100

## 2011-07-20 MED ORDER — METOPROLOL TARTRATE 1 MG/ML IV SOLN: 2.5000 mg | Freq: Two times a day (BID) | INTRAVENOUS | Status: DC

## 2011-07-20 MED ORDER — VANCOMYCIN HCL 500 MG IV SOLR
500.0000 mg | Freq: Two times a day (BID) | INTRAVENOUS | Status: DC
  Administered 2011-07-21 (×2): 500 mg via INTRAVENOUS
  Filled 2011-07-20 (×3): qty 500

## 2011-07-20 NOTE — Telephone Encounter (Signed)
Family wants Dr Elease Hashimoto to see pt and be involved with care. Pt in [place] for bowel obstruction but having issues with elevated heart rate and hospitalists want to treat it. Family concerned that they are not speaking with you for advise because of your long history, they would feel safer if you were at least consulted but they have been denied to have you consulted stating that it is not customary in this type of case. Pt's family wanted you aware she is there at [facility] long. I told him to voice his concerns to the hospitalist which he has a meeting with this evening, family agreed to plan.

## 2011-07-20 NOTE — Progress Notes (Signed)
Hayley Long is a 78 y.o. female admitted with nausea/constipation, delirium. She was found to have uti/sbo. I have reviewed her chart, seen and examined her in the presence of her daughter at bed isde. Appreciate surgical input.   SUBJECTIVE Feels better.   1. SBO (small bowel obstruction)   2. AKI (acute kidney injury)   3. Atrial fibrillation   4. UTI (lower urinary tract infection)     Past Medical History  Diagnosis Date  . CAD (coronary artery disease)   . Atrial fibrillation   . HTN (hypertension)   . Arthritis   . Carpal tunnel syndrome   . Complication of anesthesia     fights coming out of anethesia   Current Facility-Administered Medications  Medication Dose Route Frequency Provider Last Rate Last Dose  . 0.9 %  sodium chloride infusion   Intravenous STAT Dayton Bailiff, MD 75 mL/hr at 07/19/11 2339    . aspirin chewable tablet 81 mg  81 mg Oral Daily Laveda Norman, MD      . cefTRIAXone (ROCEPHIN) 1 g in dextrose 5 % 50 mL IVPB  1 g Intravenous Once Dayton Bailiff, MD   1 g at 07/19/11 1801  . dextrose 5 % and 0.9 % NaCl with KCl 20 mEq/L infusion   Intravenous Continuous Mistey Hoffert, MD      . iohexol (OMNIPAQUE) 300 MG/ML solution 100 mL  100 mL Intravenous Once PRN Medication Radiologist, MD   100 mL at 07/19/11 1753  . LORazepam (ATIVAN) injection 0.5 mg  0.5 mg Intravenous Once Rhetta Mura, MD   0.5 mg at 07/19/11 1940  . LORazepam (ATIVAN) injection 1 mg  1 mg Intravenous Once Dayton Bailiff, MD   1 mg at 07/19/11 1842  . metoprolol (LOPRESSOR) injection 2.5 mg  2.5 mg Intravenous Q12H Jaelen Gellerman, MD      . morphine 2 MG/ML injection 0.5 mg  0.5 mg Intravenous Q4H PRN Rhetta Mura, MD      . ondansetron (ZOFRAN) injection 4 mg  4 mg Intravenous Once Dayton Bailiff, MD   4 mg at 07/19/11 1613  . ondansetron (ZOFRAN) injection 4 mg  4 mg Intravenous Q8H PRN Dayton Bailiff, MD      . piperacillin-tazobactam (ZOSYN) IVPB 3.375 g  3.375 g Intravenous Q8H  Rhetta Mura, MD   3.375 g at 07/20/11 0510  . potassium chloride 10 mEq in 100 mL IVPB  10 mEq Intravenous Q1 Hr x 4 Sherrie George, [place]   10 mEq at 07/20/11 1352  . promethazine (PHENERGAN) tablet 6.25 mg  6.25 mg Oral Q6H PRN Rhetta Mura, MD      . sodium chloride 0.9 % bolus 500 mL  500 mL Intravenous Once Dayton Bailiff, MD   500 mL at 07/19/11 1615  . sodium chloride 0.9 % bolus 500 mL  500 mL Intravenous Once Dayton Bailiff, MD      . sodium chloride 0.9 % injection 3 mL  3 mL Intravenous Q12H Rhetta Mura, MD   3 mL at 07/19/11 2340  . vancomycin (VANCOCIN) 500 mg in sodium chloride 0.9 % 100 mL IVPB  500 mg Intravenous Q12H Annia Belt, PHARMD      . DISCONTD: 0.9 %  sodium chloride infusion   Intravenous Continuous Dayton Bailiff, MD 125 mL/hr at 07/19/11 1613    . DISCONTD: aspirin tablet 81 mg  81 mg Oral Daily Rhetta Mura, MD      . DISCONTD: metoprolol (LOPRESSOR) injection 2.5 mg  2.5 mg Intravenous Q6H Rhetta Mura, MD      . DISCONTD: vancomycin (VANCOCIN) 500 mg in sodium chloride 0.9 % 100 mL IVPB  500 mg Intravenous Q24H Rhetta Mura, MD   500 mg at 07/19/11 2344   Allergies  Allergen Reactions  . Corticosteroids Swelling   Principal Problem:  *SBO (small bowel obstruction) Active Problems:  Coronary artery disease  Atrial fibrillation  Hyperlipidemia  Hypertension  AKI (acute kidney injury)   Vital signs in last 24 hours: Temp:  [96.7 F (35.9 C)-98.5 F (36.9 C)] 98 F (36.7 C) (06/20 1420) Pulse Rate:  [96-116] 96  (06/20 1420) Resp:  [16-18] 16  (06/20 1420) BP: (111-129)/(59-100) 111/68 mmHg (06/20 1420) SpO2:  [89 %-96 %] 95 % (06/20 1420) Weight:  [59.421 kg (131 lb)] 59.421 kg (131 lb) (06/19 2107) Weight change:     Intake/Output from previous day: 06/19 0701 - 06/20 0700 In: -  Out: 2550 [Urine:250; Emesis/NG output:1300] Intake/Output this shift: Total I/O In: 200 [IV Piggyback:200] Out: -    Lab Results:  Basename 07/20/11 0405 07/19/11 1600  WBC 7.6 12.2*  HGB 12.7 14.8  HCT 36.0 41.6  PLT 226 245   BMET  Basename 07/20/11 0405 07/19/11 1600  NA 133* 131*  K 3.0* 3.5  CL 96 88*  CO2 27 28  GLUCOSE 95 133*  BUN 27* 35*  CREATININE 0.83 1.21*  CALCIUM 9.6 10.8*    Studies/Results: Ct Abdomen Pelvis W Contrast  07/19/2011  *RADIOLOGY REPORT*  Clinical Data: Abdominal pain and nausea.  Chronic constipation.  CT ABDOMEN AND PELVIS WITH CONTRAST  Technique:  Multidetector CT imaging of the abdomen and pelvis was performed following the standard protocol during bolus administration of intravenous contrast.  Contrast: OMNIPAQUE IOHEXOL 300 MG/ML  SOLN  Comparison: None.  Findings: Distended stomach and moderately dilated small bowel loops are seen, with transition point in the central pelvis. Nondilated distal small bowel is seen distal to this transition point.  No soft tissue mass or inflammatory process identified. This is consistent with a high-grade small bowel obstruction, likely due to adhesion.  Bilateral renal cysts are noted but there is no evidence of renal mass or hydronephrosis.  The pancreas, spleen, adrenal glands, and liver are normal in appearance.  Prior cholecystectomy noted.  The uterus and adnexa are unremarkable.  Moderate to large colonic stool burden noted, without significant colonic dilatation.  No focal inflammatory process or abnormal fluid collections are identified.  Foley catheter is seen within the bladder, which is nearly empty.  IMPRESSION:  1.  High-grade distal small bowel obstruction, with transition point in the central pelvis, most likely due to an adhesion. 2.  No mass or inflammatory process identified.  Original Report Authenticated By: Danae Orleans, M.D.   Dg Abd 2 Views  07/20/2011  *RADIOLOGY REPORT*  Clinical Data: Small bowel obstruction, abdominal pain.  ABDOMEN - 2 VIEW  Comparison: 07/19/2011  Findings: NG tube has been  placed into the stomach.  Continued small bowel dilatation.  Stool and gas within nondistended colon. Prior cholecystectomy.  No organomegaly.  No free air.  IMPRESSION: Continued small bowel obstruction pattern, not significantly changed.  Original Report Authenticated By: Cyndie Chime, M.D.   Dg Abd Acute W/chest  07/19/2011  *RADIOLOGY REPORT*  Clinical Data: Nausea and vomiting for 1 day.  ACUTE ABDOMEN SERIES (ABDOMEN 2 VIEW & CHEST 1 VIEW)  Comparison: 11/16/2003.  Findings: There is moderate enlargement of the cardiac silhouette. Ectasia  and nonaneurysmal calcification of the thoracic aorta are seen.  No pulmonary infiltrates or nodules were evident.  Hilar and mediastinal contours appear stable. No pleural abnormality is evident.  There is slight thoracic scoliosis convexity to the right.  There is degenerative spondylosis.  No pneumoperitoneum is evident on the left side down decubitus abdominal examination.  There is dilatation of multiple loops of small intestine with some air fluid levels on decubitus examination.  There is moderate gaseous and fluid distention of the transverse colon.  Rectal gas is identified.  No opaque calculi are seen.  Pelvic phleboliths are present.  There is osteophyte formation in the spine.  There is a very dense appearance of the L5 vertebral body.  This could reflect degenerative sclerosis but I cannot exclude blastic lesion.  Does the patient have low back pain?  IMPRESSION: Moderate cardiac silhouette enlargement.  No pulmonary edema, pneumonia, or pleural effusion.  Scoliosis.  Degenerative spondylosis.  No evidence of pneumoperitoneum.  There is dilatation of multiple loops of small intestine with some air fluid levels on decubitus examination.  There is moderate gaseous and fluid distention of the transverse colon.  Rectal gas is identified. There may be an element of ileus.  The small bowel dilatation is out of proportion to the colonic dilatation and I am  concerned that there may be an element of partial small-bowel obstruction.  There is a very dense appearance of the L5 vertebral body.  This could reflect degenerative sclerosis but I cannot exclude blastic lesion.  Does the patient have low back pain?  CT of abdomen and pelvis is scheduled to be performed.  Original Report Authenticated By: Crawford Givens, M.D.    Medications: I have reviewed the patient's current medications.   Physical exam GENERAL- alert, NGT to LIS HEAD- normal atraumatic, no neck masses, normal thyroid, no jvd RESPIRATORY- appears well, vitals normal, no respiratory distress, acyanotic, normal RR, ear and throat exam is normal, neck free of mass or lymphadenopathy, chest clear, no wheezing, crepitations, rhonchi, normal symmetric air entry CVS- regular rate and rhythm, S1, S2 normal, no murmur, click, rub or gallop ABDOMEN- abdomen is soft without significant tenderness, masses, organomegaly or guarding NEURO- Grossly normal EXTREMITIES- extremities normal, atraumatic, no cyanosis or edema  Plan   * SBO (small bowel obstruction)- appreciate surgery. Still obstructed. Continue bowel rest, ivf, replenish electrolytes, check tsh. Defer rest of mx to surgery.  * Uti- follow urine culture, continue current abx.  *  Atrial fibrillation- rate controlled off negative chronotropic agents. Not anticoagulated- ?fall risk, asa once can take oral.  * Hypertension- will give hydralazine prn while npo  * AKI (acute kidney injury)- combination of prerenal and arb. Better with fluids. Monitor.  * Hypokalemia- due to poor oral intake. Replenish as necessary. dvt prophylaxis. Discussed plan of care with daughter at bed side.    Hayley Long 07/20/2011 3:03 PM Pager: [phone].

## 2011-07-20 NOTE — Progress Notes (Signed)
ANTIBIOTIC CONSULT NOTE - FOLLOW UP  Pharmacy Consult for Vanco/Zosyn Indication: Acute Ischemic Colitis, Leukocytosis, UTI  Allergies  Allergen Reactions  . Corticosteroids Swelling    Patient Measurements: Height: 5\' 5"  (165.1 cm) Weight: 131 lb (59.421 kg) IBW/kg (Calculated) : 57   Vital Signs: Temp: 96.7 F (35.9 C) (06/20 0515) Temp src: Oral (06/20 0515) BP: 115/73 mmHg (06/20 0515) Pulse Rate: 102  (06/20 0515) Intake/Output from previous day: 06/19 0701 - 06/20 0700 In: -  Out: 2550 [Urine:250; Emesis/NG output:1300] Intake/Output from this shift:    Labs:  Basename 07/20/11 0405 07/19/11 1600  WBC 7.6 12.2*  HGB 12.7 14.8  PLT 226 245  LABCREA -- --  CREATININE 0.83 1.21*   Estimated Creatinine Clearance: 42.2 ml/min (by C-G formula based on Cr of 0.83). No results found for this basename: VANCOTROUGH:2,VANCOPEAK:2,VANCORANDOM:2,GENTTROUGH:2,GENTPEAK:2,GENTRANDOM:2,TOBRATROUGH:2,TOBRAPEAK:2,TOBRARND:2,AMIKACINPEAK:2,AMIKACINTROU:2,AMIKACIN:2, in the last 72 hours   Microbiology: No results found for this or any previous visit (from the past 720 hour(s)).  Anti-infectives     Start     Dose/Rate Route Frequency Ordered Stop   07/19/11 2100  piperacillin-tazobactam (ZOSYN) IVPB 3.375 g       3.375 g 12.5 mL/hr over 240 Minutes Intravenous Every 8 hours 07/19/11 2015     07/19/11 2100   vancomycin (VANCOCIN) 500 mg in sodium chloride 0.9 % 100 mL IVPB        500 mg 100 mL/hr over 60 Minutes Intravenous Every 24 hours 07/19/11 2016     07/19/11 1800   cefTRIAXone (ROCEPHIN) 1 g in dextrose 5 % 50 mL IVPB        1 g 100 mL/hr over 30 Minutes Intravenous  Once 07/19/11 1749 07/19/11 1831          Assessment: 78 yo F on Day #2 empiric Vanco and Zosyn for leukocytosis and UTI. Renal function has improved, will adjust Vanco dose. Zosyn dose remains appropriate.  Goal of Therapy:  Vancomycin trough level 15-20 mcg/ml  Plan:  1) Increase Vanco to  500mg  IV q12h 2) No changes to Zosyn  Darrol Angel, PharmD Pager: [phone] 07/20/2011,9:32 AM

## 2011-07-20 NOTE — Telephone Encounter (Signed)
New msg Pt's son called about mother being in [facility].

## 2011-07-20 NOTE — Progress Notes (Signed)
Subjective: Alert, says she thinks she's gonna die, but allot better than last PM.  Son has been with her all night , she didn't do well with NG, but seems better now.  Objective: Vital signs in last 24 hours: Temp:  [96.7 F (35.9 C)-98.5 F (36.9 C)] 96.7 F (35.9 C) (06/20 0515) Pulse Rate:  [70-116] 102  (06/20 0515) Resp:  [16-22] 18  (06/20 0515) BP: (115-129)/(59-100) 115/73 mmHg (06/20 0515) SpO2:  [89 %-98 %] 91 % (06/20 0515) Weight:  [59.421 kg (131 lb)] 59.421 kg (131 lb) (06/19 2107)  1300 ml from NG, afebrile, VSS, K+ is low, WBC is normal, film shows ongoing SBO    Intake/Output from previous day: 06/19 0701 - 06/20 0700 In: -  Out: 2550 [Urine:250; Emesis/NG output:1300] Intake/Output this shift:    General appearance: alert, cooperative and no distress Resp: clear to auscultation bilaterally GI: soft, not distended, Still draining from NG, clear Green fluid.  much less tender than last night.  not much inthe way of bowel sounds, no flatus.  Lab Results:   Basename 07/20/11 0405 07/19/11 1600  WBC 7.6 12.2*  HGB 12.7 14.8  HCT 36.0 41.6  PLT 226 245    BMET  Basename 07/20/11 0405 07/19/11 1600  NA 133* 131*  K 3.0* 3.5  CL 96 88*  CO2 27 28  GLUCOSE 95 133*  BUN 27* 35*  CREATININE 0.83 1.21*  CALCIUM 9.6 10.8*   PT/INR  Basename 07/20/11 0405  LABPROT 14.3  INR 1.09     Lab 07/20/11 0405 07/19/11 1600  AST 19 26  ALT 8 10  ALKPHOS 28* 38*  BILITOT 0.6 0.9  PROT 6.1 7.8  ALBUMIN 3.3* 4.2     Lipase     Component Value Date/Time   LIPASE 56 07/19/2011 1600     Studies/Results: Ct Abdomen Pelvis W Contrast  07/19/2011  *RADIOLOGY REPORT*  Clinical Data: Abdominal pain and nausea.  Chronic constipation.  CT ABDOMEN AND PELVIS WITH CONTRAST  Technique:  Multidetector CT imaging of the abdomen and pelvis was performed following the standard protocol during bolus administration of intravenous contrast.  Contrast: OMNIPAQUE  IOHEXOL 300 MG/ML  SOLN  Comparison: None.  Findings: Distended stomach and moderately dilated small bowel loops are seen, with transition point in the central pelvis. Nondilated distal small bowel is seen distal to this transition point.  No soft tissue mass or inflammatory process identified. This is consistent with a high-grade small bowel obstruction, likely due to adhesion.  Bilateral renal cysts are noted but there is no evidence of renal mass or hydronephrosis.  The pancreas, spleen, adrenal glands, and liver are normal in appearance.  Prior cholecystectomy noted.  The uterus and adnexa are unremarkable.  Moderate to large colonic stool burden noted, without significant colonic dilatation.  No focal inflammatory process or abnormal fluid collections are identified.  Foley catheter is seen within the bladder, which is nearly empty.  IMPRESSION:  1.  High-grade distal small bowel obstruction, with transition point in the central pelvis, most likely due to an adhesion. 2.  No mass or inflammatory process identified.  Original Report Authenticated By: Danae Orleans, M.D.   Dg Abd 2 Views  07/20/2011  *RADIOLOGY REPORT*  Clinical Data: Small bowel obstruction, abdominal pain.  ABDOMEN - 2 VIEW  Comparison: 07/19/2011  Findings: NG tube has been placed into the stomach.  Continued small bowel dilatation.  Stool and gas within nondistended colon. Prior cholecystectomy.  No  organomegaly.  No free air.  IMPRESSION: Continued small bowel obstruction pattern, not significantly changed.  Original Report Authenticated By: Cyndie Chime, M.D.   Dg Abd Acute W/chest  07/19/2011  *RADIOLOGY REPORT*  Clinical Data: Nausea and vomiting for 1 day.  ACUTE ABDOMEN SERIES (ABDOMEN 2 VIEW & CHEST 1 VIEW)  Comparison: 11/16/2003.  Findings: There is moderate enlargement of the cardiac silhouette. Ectasia and nonaneurysmal calcification of the thoracic aorta are seen.  No pulmonary infiltrates or nodules were evident.  Hilar  and mediastinal contours appear stable. No pleural abnormality is evident.  There is slight thoracic scoliosis convexity to the right.  There is degenerative spondylosis.  No pneumoperitoneum is evident on the left side down decubitus abdominal examination.  There is dilatation of multiple loops of small intestine with some air fluid levels on decubitus examination.  There is moderate gaseous and fluid distention of the transverse colon.  Rectal gas is identified.  No opaque calculi are seen.  Pelvic phleboliths are present.  There is osteophyte formation in the spine.  There is a very dense appearance of the L5 vertebral body.  This could reflect degenerative sclerosis but I cannot exclude blastic lesion.  Does the patient have low back pain?  IMPRESSION: Moderate cardiac silhouette enlargement.  No pulmonary edema, pneumonia, or pleural effusion.  Scoliosis.  Degenerative spondylosis.  No evidence of pneumoperitoneum.  There is dilatation of multiple loops of small intestine with some air fluid levels on decubitus examination.  There is moderate gaseous and fluid distention of the transverse colon.  Rectal gas is identified. There may be an element of ileus.  The small bowel dilatation is out of proportion to the colonic dilatation and I am concerned that there may be an element of partial small-bowel obstruction.  There is a very dense appearance of the L5 vertebral body.  This could reflect degenerative sclerosis but I cannot exclude blastic lesion.  Does the patient have low back pain?  CT of abdomen and pelvis is scheduled to be performed.  Original Report Authenticated By: Crawford Givens, M.D.    Medications:    . sodium chloride   Intravenous STAT  . aspirin  81 mg Oral Daily  . cefTRIAXone (ROCEPHIN)  IV  1 g Intravenous Once  . LORazepam  0.5 mg Intravenous Once  . LORazepam  1 mg Intravenous Once  . metoprolol  2.5 mg Intravenous Q6H  . ondansetron  4 mg Intravenous Once  .  piperacillin-tazobactam (ZOSYN)  IV  3.375 g Intravenous Q8H  . sodium chloride  500 mL Intravenous Once  . sodium chloride  500 mL Intravenous Once  . sodium chloride  3 mL Intravenous Q12H  . vancomycin  500 mg Intravenous Q12H  . DISCONTD: aspirin  81 mg Oral Daily  . DISCONTD: vancomycin  500 mg Intravenous Q24H    Assessment/Plan SBO vs ileus Probable UTI Chronic constipation, takes laxatives frequently at home.  MG Citrate works best. S/p cholecystectomy in past, only surgery. Decreased Mobility Afib on coumadin INR 1.09 CAD refused CABG Hx syncope Hx chronic pain Hx Hypertension Hypokalemia K+ 3.0.  Plan: check mag, start replacing K+( i have started 4 runs of , she will need more, but defer to medicine.), and continue medical RX.  Ask PT to see and help mobilize.  OOB to chair, walk with PT.      LOS: 1 day    JENNINGS,WILLARD 07/20/2011 Feels better.  Still distended but pain much improved.  No flatus  or BM.  Abdomen is soft, moderately distended, nontender on my exam.  Since she is improving with nonop management, I think that it is reasonable to continue with NG, IV fluids, and bowel rest.

## 2011-07-20 NOTE — Progress Notes (Signed)
Bilateral soft wrist Restraints discontinued @0800 , no longer appropriate for patients behavior. Patient is  alert and oriented ,calm, quiet and resting at this time, son is at bedside.

## 2011-07-20 NOTE — Progress Notes (Signed)
Pt aggressive, pulling on NG tube, attempting to get out of bed without assistance.  Encouraged to call for assistance.  Pt stating, "leave me alone." Pt at this time uncooperative, confused.  Hayley Poag, NP notified.  Order obtained for wrist restraints.  Bilateral wrist restraints applied.  Will continue to monitor pt. [place]

## 2011-07-20 NOTE — Progress Notes (Signed)
   CARE MANAGEMENT NOTE 07/20/2011  Patient:  Hayley Long, Hayley Long   Account Number:  [id number]  Date Initiated:  07/20/2011  Documentation initiated by:  Jiles Crocker  Subjective/Objective Assessment:   ADMITTED WITH SBO     Action/Plan:   PCP: Mickie Hillier, MD; LIVES WITH FAMILY MEMBERS   Anticipated DC Date:  07/27/2011   Anticipated DC Plan:  HOME W HOME HEALTH SERVICES      DC Planning Services  CM consult              Status of service:  In process, will continue to follow Medicare Important Message given?  NA - LOS <3 / Initial given by admissions (If response is "NO", the following Medicare IM given date fields will be blank)  Per UR Regulation:  Reviewed for med. necessity/level of care/duration of stay  Comments:  07/20/2011- B [place] Graefe RN, BSN, MHA

## 2011-07-20 NOTE — Progress Notes (Signed)
INITIAL ADULT NUTRITION ASSESSMENT Date: 07/20/2011   Time: 10:55 AM Reason for Assessment: Nutrition Risk for dysphagia  ASSESSMENT: Female 78 y.o.  Dx: SBO (small bowel obstruction)  Hx:  Past Medical History  Diagnosis Date  . CAD (coronary artery disease)   . Atrial fibrillation   . HTN (hypertension)   . Arthritis   . Carpal tunnel syndrome   . Complication of anesthesia     fights coming out of anethesia    Related Meds:  Scheduled Meds:   . sodium chloride   Intravenous STAT  . aspirin  81 mg Oral Daily  . cefTRIAXone (ROCEPHIN)  IV  1 g Intravenous Once  . LORazepam  0.5 mg Intravenous Once  . LORazepam  1 mg Intravenous Once  . metoprolol  2.5 mg Intravenous Q6H  . ondansetron  4 mg Intravenous Once  . piperacillin-tazobactam (ZOSYN)  IV  3.375 g Intravenous Q8H  . sodium chloride  500 mL Intravenous Once  . sodium chloride  500 mL Intravenous Once  . sodium chloride  3 mL Intravenous Q12H  . vancomycin  500 mg Intravenous Q12H  . DISCONTD: aspirin  81 mg Oral Daily  . DISCONTD: vancomycin  500 mg Intravenous Q24H   Continuous Infusions:   . DISCONTD: sodium chloride 125 mL/hr at 07/19/11 1613   PRN Meds:.iohexol, morphine injection, ondansetron (ZOFRAN) IV, promethazine   Ht: 5\' 5"  (165.1 cm)  Wt: 131 lb (59.421 kg)  Ideal Wt: 56.8 kg % Ideal Wt: 104.8% Wt Readings from Last 10 Encounters:  07/19/11 131 lb (59.421 kg)  06/16/11 133 lb 9.6 oz (60.601 kg)    Usual ZO:XWRUEAV  Body mass index is 21.80 kg/(m^2). (WNL)  Food/Nutrition Related Hx: Patient reported a poor appetite PTA. She reported she just has not felt like eating due to excessive stomach pain. She reported trouble chewing and stated it would be easier if her food was chopped up into smaller pieces. She agreed to try [employer] once diet is advanced to increase PO intake.   Labs:  CMP     Component Value Date/Time   NA 133* 07/20/2011 0405   K 3.0* 07/20/2011 0405   CL 96  07/20/2011 0405   CO2 27 07/20/2011 0405   GLUCOSE 95 07/20/2011 0405   BUN 27* 07/20/2011 0405   CREATININE 0.83 07/20/2011 0405   CALCIUM 9.6 07/20/2011 0405   PROT 6.1 07/20/2011 0405   ALBUMIN 3.3* 07/20/2011 0405   AST 19 07/20/2011 0405   ALT 8 07/20/2011 0405   ALKPHOS 28* 07/20/2011 0405   BILITOT 0.6 07/20/2011 0405   GFRNONAA 61* 07/20/2011 0405   GFRAA 71* 07/20/2011 0405     Intake/Output Summary (Last 24 hours) at 07/20/11 1055 Last data filed at 07/20/11 1024  Gross per 24 hour  Intake      0 ml  Output   2550 ml  Net  -2550 ml     Diet Order: NPO  Supplements/Tube Feeding: none at this time  IVF:    DISCONTD: sodium chloride Last Rate: 125 mL/hr at 07/19/11 1613    Estimated Nutritional Needs:   Kcal: 1220-1440 Protein: 66-78 grams Fluid: 1 ml per kcal intake  NUTRITION DIAGNOSIS: -Inadequate oral intake (NI-2.1).  Status: Ongoing  RELATED TO: inability to eat and SBO   AS EVIDENCE BY: NPO status  MONITORING/EVALUATION(Goals): Diet advancement/ tolerance, Labs, weights 1. Recommend diet advancement as medically able to meet > 90% of estimated energy needs.   EDUCATION NEEDS: -No  education needs identified at this time  INTERVENTION: 1. Recommend diet advancement as medically able. Recommend dysphagia 3 diet once patient on PO diet.  2. Recommend order patient Vanilla Ensure BID once diet advanced.  3. RD available for nutrition needs.   Dietitian [phone]  DOCUMENTATION CODES Per approved criteria  -Not Applicable    Iven Finn [facility] 07/20/2011, 10:55 AM

## 2011-07-21 ENCOUNTER — Inpatient Hospital Stay (HOSPITAL_COMMUNITY): Payer: Medicare Other

## 2011-07-21 ENCOUNTER — Other Ambulatory Visit: Payer: Medicare Other

## 2011-07-21 DIAGNOSIS — E782 Mixed hyperlipidemia: Secondary | ICD-10-CM

## 2011-07-21 DIAGNOSIS — N179 Acute kidney failure, unspecified: Secondary | ICD-10-CM

## 2011-07-21 DIAGNOSIS — K565 Intestinal adhesions [bands], unspecified as to partial versus complete obstruction: Secondary | ICD-10-CM

## 2011-07-21 DIAGNOSIS — I4891 Unspecified atrial fibrillation: Secondary | ICD-10-CM

## 2011-07-21 LAB — GLUCOSE, CAPILLARY
Glucose-Capillary: 94 mg/dL (ref 70–99)
Glucose-Capillary: 144 mg/dL — ABNORMAL HIGH (ref 70–99)

## 2011-07-21 LAB — COMPREHENSIVE METABOLIC PANEL
BUN: 22 mg/dL (ref 6–23)
CO2: 27 mEq/L (ref 19–32)
Calcium: 9.4 mg/dL (ref 8.4–10.5)
Chloride: 98 mEq/L (ref 96–112)
Creatinine, Ser: 0.77 mg/dL (ref 0.50–1.10)
GFR calc Af Amer: 84 mL/min — ABNORMAL LOW (ref >90)
GFR calc non Af Amer: 73 mL/min — ABNORMAL LOW (ref >90)
Glucose, Bld: 107 mg/dL — ABNORMAL HIGH (ref 70–99)
Total Bilirubin: 0.6 mg/dL (ref 0.3–1.2)

## 2011-07-21 LAB — CBC
HCT: 36.3 % (ref 36.0–46.0)
Hemoglobin: 12.6 g/dL (ref 12.0–15.0)
MCH: 31 pg (ref 26.0–34.0)
MCV: 89.2 fL (ref 78.0–100.0)
RBC: 4.07 MIL/uL (ref 3.87–5.11)

## 2011-07-21 MED ORDER — ZINC TRACE METAL 1 MG/ML IV SOLN
INTRAVENOUS | Status: AC
  Administered 2011-07-21: 21:00:00 via INTRAVENOUS
  Filled 2011-07-21: qty 1000

## 2011-07-21 MED ORDER — INSULIN ASPART 100 UNIT/ML ~~LOC~~ SOLN
0.0000 [IU] | SUBCUTANEOUS | Status: DC
  Administered 2011-07-21: 1 [IU] via SUBCUTANEOUS
  Administered 2011-07-22 (×3): 2 [IU] via SUBCUTANEOUS
  Administered 2011-07-22 – 2011-07-23 (×2): 3 [IU] via SUBCUTANEOUS
  Administered 2011-07-23: 2 [IU] via SUBCUTANEOUS
  Administered 2011-07-23: 1 [IU] via SUBCUTANEOUS
  Administered 2011-07-23 (×2): 2 [IU] via SUBCUTANEOUS
  Administered 2011-07-23 – 2011-07-24 (×4): 1 [IU] via SUBCUTANEOUS

## 2011-07-21 MED ORDER — ZINC TRACE METAL 1 MG/ML IV SOLN
INTRAVENOUS | Status: DC
  Filled 2011-07-21: qty 1000

## 2011-07-21 MED ORDER — SODIUM CHLORIDE 0.9 % IJ SOLN
10.0000 mL | INTRAMUSCULAR | Status: DC | PRN
  Administered 2011-07-24 – 2011-07-27 (×3): 10 mL

## 2011-07-21 MED ORDER — POTASSIUM CHLORIDE 10 MEQ/100ML IV SOLN
10.0000 meq | INTRAVENOUS | Status: AC
  Administered 2011-07-21 (×3): 10 meq via INTRAVENOUS
  Filled 2011-07-21 (×3): qty 100

## 2011-07-21 MED ORDER — FAT EMULSION 20 % IV EMUL
240.0000 mL | INTRAVENOUS | Status: AC
  Administered 2011-07-21: 240 mL via INTRAVENOUS
  Filled 2011-07-21: qty 250

## 2011-07-21 MED ORDER — KCL IN DEXTROSE-NACL 20-5-0.9 MEQ/L-%-% IV SOLN
INTRAVENOUS | Status: DC
  Filled 2011-07-21: qty 1000

## 2011-07-21 NOTE — Progress Notes (Signed)
Peripherally Inserted Central Catheter/Midline Placement  The IV Nurse has discussed with the patient and/or persons authorized to consent for the patient, the purpose of this procedure and the potential benefits and risks involved with this procedure.  The benefits include less needle sticks, lab draws from the catheter and patient may be discharged home with the catheter.  Risks include, but not limited to, infection, bleeding, blood clot (thrombus formation), and puncture of an artery; nerve damage and irregular heat beat.  Alternatives to this procedure were also discussed.  PICC/Midline Placement Documentation        Dorena Bodo 07/21/2011, 7:34 PM

## 2011-07-21 NOTE — Progress Notes (Signed)
Subjective: She doesn't know where she is just in Bed,  NG replaced last PM she was confused and pulled it out.  I just tried to irrigate it and nothing has come back so far.  Abd flat plate being done now.  Objective: Vital signs in last 24 hours: Temp:  [97.8 F (36.6 C)-98 F (36.7 C)] 98 F (36.7 C) (06/21 0438) Pulse Rate:  [96-118] 110  (06/21 0438) Resp:  [16-18] 18  (06/21 0438) BP: (111-132)/(68-85) 130/85 mmHg (06/21 0438) SpO2:  [94 %-95 %] 95 % (06/21 0438) Weight:  [62.3 kg (137 lb 5.6 oz)] 62.3 kg (137 lb 5.6 oz) (06/21 0438)    NG shows 450 recorded yesterday, afebrile, BSS, Tachycardic/Afib, K+ up to 3.3  Intake/Output from previous day: 06/20 0701 - 06/21 0700 In: 1200 [I.V.:900; IV Piggyback:300] Out: 1350 [Urine:900; Emesis/NG output:450] Intake/Output this shift:    General appearance: alert, cooperative, no distress and not sure where she is.  says she feels better. Resp: clear to auscultation bilaterally GI: soft, mildly distended, BS hypoactive.  She can't really tell me if she has had flatus  Lab Results:   Basename 07/21/11 0410 07/20/11 0405  WBC 8.1 7.6  HGB 12.6 12.7  HCT 36.3 36.0  PLT 221 226    BMET  Basename 07/21/11 0410 07/20/11 0405  NA 136 133*  K 3.3* 3.0*  CL 98 96  CO2 27 27  GLUCOSE 107* 95  BUN 22 27*  CREATININE 0.77 0.83  CALCIUM 9.4 9.6   PT/INR  Basename 07/20/11 0405  LABPROT 14.3  INR 1.09     Lab 07/21/11 0410 07/20/11 0405 07/19/11 1600  AST 21 19 26   ALT 9 8 10   ALKPHOS 28* 28* 38*  BILITOT 0.6 0.6 0.9  PROT 6.3 6.1 7.8  ALBUMIN 3.3* 3.3* 4.2     Lipase     Component Value Date/Time   LIPASE 56 07/19/2011 1600     Studies/Results: Ct Abdomen Pelvis W Contrast  07/19/2011  *RADIOLOGY REPORT*  Clinical Data: Abdominal pain and nausea.  Chronic constipation.  CT ABDOMEN AND PELVIS WITH CONTRAST  Technique:  Multidetector CT imaging of the abdomen and pelvis was performed following the standard  protocol during bolus administration of intravenous contrast.  Contrast: OMNIPAQUE IOHEXOL 300 MG/ML  SOLN  Comparison: None.  Findings: Distended stomach and moderately dilated small bowel loops are seen, with transition point in the central pelvis. Nondilated distal small bowel is seen distal to this transition point.  No soft tissue mass or inflammatory process identified. This is consistent with a high-grade small bowel obstruction, likely due to adhesion.  Bilateral renal cysts are noted but there is no evidence of renal mass or hydronephrosis.  The pancreas, spleen, adrenal glands, and liver are normal in appearance.  Prior cholecystectomy noted.  The uterus and adnexa are unremarkable.  Moderate to large colonic stool burden noted, without significant colonic dilatation.  No focal inflammatory process or abnormal fluid collections are identified.  Foley catheter is seen within the bladder, which is nearly empty.  IMPRESSION:  1.  High-grade distal small bowel obstruction, with transition point in the central pelvis, most likely due to an adhesion. 2.  No mass or inflammatory process identified.  Original Report Authenticated By: Danae Orleans, M.D.   Dg Abd 2 Views  07/20/2011  *RADIOLOGY REPORT*  Clinical Data: Small bowel obstruction, abdominal pain.  ABDOMEN - 2 VIEW  Comparison: 07/19/2011  Findings: NG tube has been placed  into the stomach.  Continued small bowel dilatation.  Stool and gas within nondistended colon. Prior cholecystectomy.  No organomegaly.  No free air.  IMPRESSION: Continued small bowel obstruction pattern, not significantly changed.  Original Report Authenticated By: Cyndie Chime, M.D.   Dg Abd Acute W/chest  07/19/2011  *RADIOLOGY REPORT*  Clinical Data: Nausea and vomiting for 1 day.  ACUTE ABDOMEN SERIES (ABDOMEN 2 VIEW & CHEST 1 VIEW)  Comparison: 11/16/2003.  Findings: There is moderate enlargement of the cardiac silhouette. Ectasia and nonaneurysmal calcification of  the thoracic aorta are seen.  No pulmonary infiltrates or nodules were evident.  Hilar and mediastinal contours appear stable. No pleural abnormality is evident.  There is slight thoracic scoliosis convexity to the right.  There is degenerative spondylosis.  No pneumoperitoneum is evident on the left side down decubitus abdominal examination.  There is dilatation of multiple loops of small intestine with some air fluid levels on decubitus examination.  There is moderate gaseous and fluid distention of the transverse colon.  Rectal gas is identified.  No opaque calculi are seen.  Pelvic phleboliths are present.  There is osteophyte formation in the spine.  There is a very dense appearance of the L5 vertebral body.  This could reflect degenerative sclerosis but I cannot exclude blastic lesion.  Does the patient have low back pain?  IMPRESSION: Moderate cardiac silhouette enlargement.  No pulmonary edema, pneumonia, or pleural effusion.  Scoliosis.  Degenerative spondylosis.  No evidence of pneumoperitoneum.  There is dilatation of multiple loops of small intestine with some air fluid levels on decubitus examination.  There is moderate gaseous and fluid distention of the transverse colon.  Rectal gas is identified. There may be an element of ileus.  The small bowel dilatation is out of proportion to the colonic dilatation and I am concerned that there may be an element of partial small-bowel obstruction.  There is a very dense appearance of the L5 vertebral body.  This could reflect degenerative sclerosis but I cannot exclude blastic lesion.  Does the patient have low back pain?  CT of abdomen and pelvis is scheduled to be performed.  Original Report Authenticated By: Crawford Givens, M.D.    Medications:    . sodium chloride   Intravenous STAT  . aspirin  81 mg Oral Daily  . pantoprazole (PROTONIX) IV  40 mg Intravenous Q24H  . piperacillin-tazobactam (ZOSYN)  IV  3.375 g Intravenous Q8H  . potassium chloride  10  mEq Intravenous Q1 Hr x 4  . potassium chloride  10 mEq Intravenous Q1 Hr x 3  . sodium chloride  3 mL Intravenous Q12H  . vancomycin  500 mg Intravenous Q12H  . DISCONTD: metoprolol  2.5 mg Intravenous Q6H  . DISCONTD: metoprolol  2.5 mg Intravenous Q12H    Assessment/Plan SBO vs ileus  Probable UTI  Chronic constipation, takes laxatives frequently at home. MG Citrate works best.  S/p cholecystectomy in past, only surgery.  Decreased Mobility  Afib on coumadin INR 1.09  CAD refused CABG  Hx syncope  Hx chronic pain  Hx Hypertension  Hypokalemia K+ 3.0.  Plan:  Film is pending see if tube is in proper place, and if she's made any improvement. On exam I can't see much improvement.  LOS: 2 days    JENNINGS,WILLARD 07/21/2011 Not much improvement but not worse.  No fevers, leukocytosis, or abdominal pain.  If she doesn't show improvement then she may need exploration.  With her age and comorbidities,  surgery would be difficult for her

## 2011-07-21 NOTE — Evaluation (Signed)
Physical Therapy Evaluation Patient Details Name: Hayley Long MRN: [id number] DOB: Mar 18, 1920 Today's Date: 07/21/2011 Time: 1040-1110 PT Time Calculation (min): 30 min  PT Assessment / Plan / Recommendation Clinical Impression  Pt admitted for SBO and UTI, currently with NG tube and low K+, but pt receiving supplemental K+. Pt presents with generalized weakness, decreased functional mobility and activity tolerance. Pt will benefit from skilled PT to address below impairments and reduce falls risk and caregiver burden at next venue of care. Discussed DC options with son, pending progress in hospital and level of assist he can provide working from home. DC status TBD.     PT Assessment  Patient needs continued PT services    Follow Up Recommendations  Skilled nursing facility;Home health PT;Supervision/Assistance - 24 hour    Barriers to Discharge Other (comment) (High level of assist needed)      lEquipment Recommendations  Defer to next venue    Recommendations for Other Services OT consult   Frequency Min 3X/week    Precautions / Restrictions Precautions Precautions: Fall;Other (comment) (NG tube) Restrictions Weight Bearing Restrictions: No Other Position/Activity Restrictions: HOB 30 seg   Pertinent Vitals/Pain 10/10 abdominal and facial pain from NG tube. Nursing made aware      Mobility  Bed Mobility Bed Mobility: Rolling Left;Left Sidelying to Sit;Sitting - Scoot to Edge of Bed Rolling Left: 4: Min assist Left Sidelying to Sit: 3: Mod assist;With rails Sitting - Scoot to Edge of Bed: 4: Min assist Details for Bed Mobility Assistance: Cues for initiation, sequence, and hand placement. Assist for reaching and lifting trunk Transfers Transfers: Sit to Stand;Stand to Sit Sit to Stand: With upper extremity assist;1: +2 Total assist;From bed Sit to Stand: Patient Percentage: 60% Stand to Sit: 3: Mod assist;With upper extremity assist;To chair/3-in-1 Details for  Transfer Assistance: Cues and manual facilitation for hand placement, but pt just continued to hold onto RW for sit and stand. Lifting and lowering assist required Ambulation/Gait Ambulation/Gait Assistance: 2: Max assist (Max>Mod assist ) Ambulation Distance (Feet): 30 Feet Assistive device: Rolling walker Ambulation/Gait Assistance Details: 2nd person for chair and equipment. Cues for posture, hand palcement on RW, and bil LE clearance with swing phase. Pt collided with several obstacles, unaware of errors and unable to correct. Generally unstable, unable to maintain straight path. Pt/family report better stability when pt has her shoes on.  Gait Pattern: Step-through pattern;Decreased stride length;Trunk flexed Stairs: No Wheelchair Mobility Wheelchair Mobility: No         PT Diagnosis: Difficulty walking;Generalized weakness;Acute pain  PT Problem List: Decreased strength;Decreased activity tolerance;Decreased balance;Decreased mobility;Decreased cognition;Decreased knowledge of use of DME;Decreased safety awareness;Decreased knowledge of precautions;Pain PT Treatment Interventions: Gait training;DME instruction;Stair training;Functional mobility training;Therapeutic activities;Therapeutic exercise;Balance training;Neuromuscular re-education;Cognitive remediation;Patient/family education   PT Goals Acute Rehab PT Goals PT Goal Formulation: With patient/family Time For Goal Achievement: 08/04/11 Potential to Achieve Goals: Good Pt will go Supine/Side to Sit: with supervision PT Goal: Supine/Side to Sit - Progress: Goal set today Pt will go Sit to Supine/Side: with supervision PT Goal: Sit to Supine/Side - Progress: Goal set today Pt will go Sit to Stand: with min assist;with upper extremity assist PT Goal: Sit to Stand - Progress: Goal set today Pt will go Stand to Sit: with min assist;with upper extremity assist PT Goal: Stand to Sit - Progress: Goal set today Pt will Ambulate: 16 -  50 feet;with min assist;with least restrictive assistive device PT Goal: Ambulate - Progress: Goal set today Pt will Go  Up / Down Stairs: 1-2 stairs;with min assist;with least restrictive assistive device PT Goal: Up/Down Stairs - Progress: Goal set today  Visit Information  Last PT Received On: 07/21/11 Assistance Needed: +2    Subjective Data  Subjective: I'm falling apart Patient Stated Goal: Return to higher level of function and inpependence   Prior Functioning  Home Living Lives With: Son Available Help at Discharge: Family;Available 24 hours/day (Son works from home) Type of Home: House Home Access: Stairs to enter [employer] of Steps: 1 (threshold only) Entrance Stairs-Rails: None Home Layout: One level Bathroom Shower/Tub: Health visitor: Standard Home Adaptive Equipment: Straight cane;Walker - rolling Prior Function Level of Independence: Independent with assistive device(s) (Uses cane primarily) Able to Take Stairs?: No Driving: No Comments: Son states pt could walk up/down every isle in grocery store PTA.  Communication Communication: HOH    Cognition  Overall Cognitive Status: Impaired Area of Impairment: Safety/judgement Arousal/Alertness: Awake/alert    Extremity/Trunk Assessment Right Upper Extremity Assessment RUE ROM/Strength/Tone: WFL for tasks assessed Left Upper Extremity Assessment LUE ROM/Strength/Tone: WFL for tasks assessed Right Lower Extremity Assessment RLE ROM/Strength/Tone: Deficits RLE ROM/Strength/Tone Deficits: Grossly 3/5 throughout Left Lower Extremity Assessment LLE ROM/Strength/Tone: Deficits LLE ROM/Strength/Tone Deficits: Grossly 3/5 throughout       End of Session PT - End of Session Equipment Utilized During Treatment: Gait belt;Other (comment) (NG tube clamped for walk) Activity Tolerance: Patient limited by fatigue Patient left: in chair;with call bell/phone within reach;with family/visitor  present Nurse Communication: Mobility status   Virl Cagey, PT 07/21/2011, 11:31 AM

## 2011-07-21 NOTE — Progress Notes (Signed)
Nutrition Note/Consutl for new TPN  Full Assessment 6/20.  Pt to begin TPN for prolonged ileus vs SBO.  Clinimix E 5/15 at 40 ml/hr.  20% lipids 10 ml/hr MWF.  TPN to increase to goal of 60 ml/hr which will provide an average of 1228 kcals, 72 grams protein daily.  Diet:  NPO  Monitor tpn and diet advancement.  Jeoffrey Massed [phone]
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
## 2011-07-21 NOTE — Telephone Encounter (Signed)
Fu msg Pt's son called back and he talked to dr in hospital.
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
## 2011-07-21 NOTE — Progress Notes (Signed)
Patient, calm,resting, and cooperative.  patient able  ambulate with PT, asleep at this time. Restraints discontinued no longer appropriate for patients behavior.

## 2011-07-21 NOTE — Telephone Encounter (Signed)
Son called and the mtg with hospitalist went well there was just a misunderstanding , Dr Elease Hashimoto dont need to follow up.

## 2011-07-22 ENCOUNTER — Inpatient Hospital Stay (HOSPITAL_COMMUNITY): Payer: Medicare Other

## 2011-07-22 DIAGNOSIS — N179 Acute kidney failure, unspecified: Secondary | ICD-10-CM

## 2011-07-22 DIAGNOSIS — K565 Intestinal adhesions [bands], unspecified as to partial versus complete obstruction: Secondary | ICD-10-CM

## 2011-07-22 DIAGNOSIS — I4891 Unspecified atrial fibrillation: Secondary | ICD-10-CM

## 2011-07-22 DIAGNOSIS — E782 Mixed hyperlipidemia: Secondary | ICD-10-CM

## 2011-07-22 LAB — COMPREHENSIVE METABOLIC PANEL
AST: 20 U/L (ref 0–37)
Albumin: 3.2 g/dL — ABNORMAL LOW (ref 3.5–5.2)
Alkaline Phosphatase: 28 U/L — ABNORMAL LOW (ref 39–117)
BUN: 12 mg/dL (ref 6–23)
CO2: 28 mEq/L (ref 19–32)
Glucose, Bld: 186 mg/dL — ABNORMAL HIGH (ref 70–99)
Total Bilirubin: 0.5 mg/dL (ref 0.3–1.2)
Total Protein: 6.3 g/dL (ref 6.0–8.3)

## 2011-07-22 LAB — DIFFERENTIAL
Basophils Absolute: 0 10*3/uL (ref 0.0–0.1)
Basophils Relative: 0 % (ref 0–1)
Eosinophils Absolute: 0.2 10*3/uL (ref 0.0–0.7)
Monocytes Absolute: 0.9 10*3/uL (ref 0.1–1.0)
Monocytes Relative: 11 % (ref 3–12)
Neutro Abs: 6.1 10*3/uL (ref 1.7–7.7)
Neutrophils Relative %: 70 % (ref 43–77)

## 2011-07-22 LAB — GLUCOSE, CAPILLARY
Glucose-Capillary: 166 mg/dL — ABNORMAL HIGH (ref 70–99)
Glucose-Capillary: 183 mg/dL — ABNORMAL HIGH (ref 70–99)
Glucose-Capillary: 209 mg/dL — ABNORMAL HIGH (ref 70–99)

## 2011-07-22 LAB — CBC
Hemoglobin: 13.1 g/dL (ref 12.0–15.0)
MCH: 31.7 pg (ref 26.0–34.0)
MCV: 88.9 fL (ref 78.0–100.0)
RBC: 4.13 MIL/uL (ref 3.87–5.11)

## 2011-07-22 LAB — MAGNESIUM: Magnesium: 1.9 mg/dL (ref 1.5–2.5)

## 2011-07-22 LAB — CHOLESTEROL, TOTAL: Cholesterol: 131 mg/dL (ref 0–200)

## 2011-07-22 LAB — PROTIME-INR: Prothrombin Time: 16 seconds — ABNORMAL HIGH (ref 11.6–15.2)

## 2011-07-22 LAB — PHOSPHORUS: Phosphorus: 1.4 mg/dL — ABNORMAL LOW (ref 2.3–4.6)

## 2011-07-22 MED ORDER — ACETAMINOPHEN 10 MG/ML IV SOLN
1000.0000 mg | Freq: Four times a day (QID) | INTRAVENOUS | Status: AC
  Administered 2011-07-23 (×2): 1000 mg via INTRAVENOUS
  Filled 2011-07-22 (×4): qty 100

## 2011-07-22 MED ORDER — CLINIMIX E/DEXTROSE (5/15) 5 % IV SOLN
INTRAVENOUS | Status: AC
  Administered 2011-07-22: 18:00:00 via INTRAVENOUS
  Filled 2011-07-22: qty 2000

## 2011-07-22 MED ORDER — DEXTROSE 5 % IV SOLN
20.0000 mmol | Freq: Once | INTRAVENOUS | Status: AC
  Administered 2011-07-22: 20 mmol via INTRAVENOUS
  Filled 2011-07-22: qty 6.67

## 2011-07-22 MED ORDER — POTASSIUM CHLORIDE IN NACL 20-0.9 MEQ/L-% IV SOLN
INTRAVENOUS | Status: DC
  Administered 2011-07-22: 21:00:00 via INTRAVENOUS
  Administered 2011-07-23: 50 mL via INTRAVENOUS
  Filled 2011-07-22 (×4): qty 1000

## 2011-07-22 MED ORDER — POTASSIUM CHLORIDE 10 MEQ/100ML IV SOLN
10.0000 meq | INTRAVENOUS | Status: AC
  Administered 2011-07-22 (×3): 10 meq via INTRAVENOUS
  Filled 2011-07-22 (×3): qty 100

## 2011-07-22 MED ORDER — SODIUM CHLORIDE 0.9 % IV SOLN: INTRAVENOUS | Status: DC

## 2011-07-22 MED ORDER — FLEET ENEMA 7-19 GM/118ML RE ENEM: 1.0000 | ENEMA | Freq: Once | RECTAL | Status: DC

## 2011-07-22 MED ORDER — KCL IN DEXTROSE-NACL 20-5-0.9 MEQ/L-%-% IV SOLN: INTRAVENOUS | Status: DC

## 2011-07-22 MED ORDER — MAGNESIUM SULFATE 40 MG/ML IJ SOLN
2.0000 g | Freq: Once | INTRAMUSCULAR | Status: AC
  Administered 2011-07-22: 2 g via INTRAVENOUS
  Filled 2011-07-22: qty 50

## 2011-07-22 NOTE — Progress Notes (Signed)
ANTIBIOTIC CONSULT NOTE - FOLLOW UP  Pharmacy Consult for Zosyn Indication: Acute Ischemic Colitis, UTI  Allergies  Allergen Reactions  . Corticosteroids Swelling    Patient Measurements: Height: 5\' 5"  (165.1 cm) Weight: 136 lb 11 oz (62 kg) IBW/kg (Calculated) : 57   Vital Signs: Temp: 98 F (36.7 C) (06/22 0651) Temp src: Oral (06/22 0651) BP: 150/86 mmHg (06/22 0651) Pulse Rate: 93  (06/22 0651) Intake/Output from previous day: 06/21 0701 - 06/22 0700 In: 0  Out: 2125 [Urine:1925; Emesis/NG output:200] Intake/Output from this shift:    Labs:  Basename 07/22/11 0434 07/21/11 0410 07/20/11 0405  WBC 8.8 8.1 7.6  HGB 13.1 12.6 12.7  PLT 228 221 226  LABCREA -- -- --  CREATININE 0.54 0.77 0.83   Estimated Creatinine Clearance: 43.7 ml/min (by C-G formula based on Cr of 0.54). No results found for this basename: VANCOTROUGH:2,VANCOPEAK:2,VANCORANDOM:2,GENTTROUGH:2,GENTPEAK:2,GENTRANDOM:2,TOBRATROUGH:2,TOBRAPEAK:2,TOBRARND:2,AMIKACINPEAK:2,AMIKACINTROU:2,AMIKACIN:2, in the last 72 hours   Microbiology: No results found for this or any previous visit (from the past 720 hour(s)).  Anti-infectives     Start     Dose/Rate Route Frequency Ordered Stop   07/20/11 2359   vancomycin (VANCOCIN) 500 mg in sodium chloride 0.9 % 100 mL IVPB  Status:  Discontinued        500 mg 100 mL/hr over 60 Minutes Intravenous Every 12 hours 07/20/11 0935 07/21/11 1250   07/19/11 2100   piperacillin-tazobactam (ZOSYN) IVPB 3.375 g        3.375 g 12.5 mL/hr over 240 Minutes Intravenous Every 8 hours 07/19/11 2015     07/19/11 2100   vancomycin (VANCOCIN) 500 mg in sodium chloride 0.9 % 100 mL IVPB  Status:  Discontinued        500 mg 100 mL/hr over 60 Minutes Intravenous Every 24 hours 07/19/11 2016 07/20/11 0935   07/19/11 1800   cefTRIAXone (ROCEPHIN) 1 g in dextrose 5 % 50 mL IVPB        1 g 100 mL/hr over 30 Minutes Intravenous  Once 07/19/11 1749 07/19/11 1831           Assessment: 78 yo F on Day #4 Zosyn for leukocytosis and UTI. No urine culture  Goal of Therapy:  Zosyn adjustment per renal function  Plan:  1) No changes to Zosyn 2) Will sign off 3) What is planned duration of therapy?   Hessie Knows, PharmD, BCPS Pager [phone] 07/22/2011 8:24 AM
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
## 2011-07-22 NOTE — Progress Notes (Signed)
Physical Therapy Treatment Patient Details Name: Hayley Long MRN: [id number] DOB: 1920-10-12 Today's Date: 07/22/2011 Time: 6578-4696 PT Time Calculation (min): 37 min  PT Assessment / Plan / Recommendation Comments on Treatment Session  Pt progressing very well with increased ambulation distance stating that she feels a little better today.  Continues to require +2 assist for chair follow/line management with max cuing and manual assist for using RW.  D/C plans remain unclear.     Follow Up Recommendations  Skilled nursing facility;Home health PT;Supervision/Assistance - 24 hour    Barriers to Discharge        Equipment Recommendations  Defer to next venue    Recommendations for Other Services OT consult  Frequency Min 3X/week   Plan Discharge plan remains appropriate    Precautions / Restrictions Precautions Precautions: Fall;Other (comment) (NG tube) Restrictions Weight Bearing Restrictions: No Other Position/Activity Restrictions: HOB 30 seg   Pertinent Vitals/Pain Some pain, but feeling better    Mobility  Bed Mobility Bed Mobility: Not assessed (Pt in recliner when PT arrived. ) Transfers Transfers: Sit to Stand;Stand to Sit Sit to Stand: 4: Min assist;3: Mod assist;With upper extremity assist;With armrests Stand to Sit: 3: Mod assist;With upper extremity assist;To chair/3-in-1 Details for Transfer Assistance: Max verbal and manual cues for hand placement and safety when sitting and standing.  Requires assist for rising and steadying due to initial LOB posteriorly.   Ambulation/Gait Ambulation/Gait Assistance: 3: Mod assist Ambulation Distance (Feet): 350 Feet Assistive device: Rolling walker Ambulation/Gait Assistance Details: Continues to require 2 person assist for chair follow and line management.  Max cuing and assist for negotiating around corners and objects.  Continues to want to push RW too far ahead of her and has decreased safety awareness.  Gait  Pattern: Step-through pattern;Decreased stride length;Trunk flexed    Exercises     PT Diagnosis:    PT Problem List:   PT Treatment Interventions:     PT Goals Acute Rehab PT Goals PT Goal Formulation: With patient/family Time For Goal Achievement: 08/04/11 Potential to Achieve Goals: Good Pt will go Sit to Stand: with min assist;with upper extremity assist PT Goal: Sit to Stand - Progress: Progressing toward goal Pt will go Stand to Sit: with min assist;with upper extremity assist PT Goal: Stand to Sit - Progress: Progressing toward goal Pt will Ambulate: >150 feet;with min assist;with rolling walker PT Goal: Ambulate - Progress: Updated due to goal met  Visit Information  Last PT Received On: 07/22/11 Assistance Needed: +2    Subjective Data  Subjective: I'm feeling a little better Patient Stated Goal: Return to higher level of function and inpependence   Cognition  Overall Cognitive Status: Impaired Area of Impairment: Safety/judgement Arousal/Alertness: Awake/alert Orientation Level: Appears intact for tasks assessed Behavior During Session: [facility] for tasks performed Safety/Judgement: Decreased awareness of safety precautions;Decreased safety judgement for tasks assessed;Decreased awareness of need for assistance;Impulsive    Balance     End of Session PT - End of Session Equipment Utilized During Treatment: Gait belt;Other (comment) (NG tube clamped for walk) Activity Tolerance: Patient tolerated treatment well Patient left: in chair;with call bell/phone within reach;with family/visitor present Nurse Communication: Mobility status    Page, Meribeth Mattes 07/22/2011, 5:29 PM

## 2011-07-23 ENCOUNTER — Encounter (HOSPITAL_COMMUNITY): Payer: Self-pay | Admitting: Anesthesiology

## 2011-07-23 ENCOUNTER — Inpatient Hospital Stay (HOSPITAL_COMMUNITY): Payer: Medicare Other

## 2011-07-23 ENCOUNTER — Inpatient Hospital Stay (HOSPITAL_COMMUNITY): Payer: Medicare Other | Admitting: Anesthesiology

## 2011-07-23 ENCOUNTER — Encounter (HOSPITAL_COMMUNITY)
Admission: EM | Disposition: A | Discharge status: Home Health | Payer: Self-pay | Source: Home / Self Care | Attending: Internal Medicine

## 2011-07-23 DIAGNOSIS — N179 Acute kidney failure, unspecified: Secondary | ICD-10-CM

## 2011-07-23 DIAGNOSIS — E782 Mixed hyperlipidemia: Secondary | ICD-10-CM

## 2011-07-23 DIAGNOSIS — I4891 Unspecified atrial fibrillation: Secondary | ICD-10-CM

## 2011-07-23 DIAGNOSIS — K565 Intestinal adhesions [bands], unspecified as to partial versus complete obstruction: Secondary | ICD-10-CM

## 2011-07-23 HISTORY — PX: LAPAROSCOPY: SHX197

## 2011-07-23 LAB — GLUCOSE, CAPILLARY
Glucose-Capillary: 211 mg/dL — ABNORMAL HIGH (ref 70–99)
Glucose-Capillary: 183 mg/dL — ABNORMAL HIGH (ref 70–99)
Glucose-Capillary: 169 mg/dL — ABNORMAL HIGH (ref 70–99)

## 2011-07-23 LAB — CBC
MCH: 30.4 pg (ref 26.0–34.0)
MCV: 88.2 fL (ref 78.0–100.0)
Platelets: 227 10*3/uL (ref 150–400)
RDW: 12.7 % (ref 11.5–15.5)
WBC: 7.4 10*3/uL (ref 4.0–10.5)

## 2011-07-23 LAB — DIFFERENTIAL
Basophils Absolute: 0 10*3/uL (ref 0.0–0.1)
Eosinophils Absolute: 0.2 10*3/uL (ref 0.0–0.7)
Eosinophils Relative: 3 % (ref 0–5)
Lymphocytes Relative: 21 % (ref 12–46)
Neutrophils Relative %: 65 % (ref 43–77)

## 2011-07-23 LAB — COMPREHENSIVE METABOLIC PANEL
ALT: 37 U/L — ABNORMAL HIGH (ref 0–35)
BUN: 13 mg/dL (ref 6–23)
CO2: 29 mEq/L (ref 19–32)
Calcium: 8.9 mg/dL (ref 8.4–10.5)
GFR calc Af Amer: >90 mL/min (ref >90)
GFR calc non Af Amer: 82 mL/min — ABNORMAL LOW (ref >90)
Glucose, Bld: 144 mg/dL — ABNORMAL HIGH (ref 70–99)
Sodium: 135 mEq/L (ref 135–145)

## 2011-07-23 LAB — MRSA PCR SCREENING: MRSA by PCR: NEGATIVE

## 2011-07-23 LAB — MAGNESIUM: Magnesium: 2.3 mg/dL (ref 1.5–2.5)

## 2011-07-23 SURGERY — LAPAROSCOPY, DIAGNOSTIC
Anesthesia: General | Site: Abdomen | Wound class: Clean

## 2011-07-23 MED ORDER — SUCCINYLCHOLINE CHLORIDE 20 MG/ML IJ SOLN
INTRAMUSCULAR | Status: DC | PRN
  Administered 2011-07-23: 100 mg via INTRAVENOUS

## 2011-07-23 MED ORDER — ONDANSETRON HCL 4 MG/2ML IJ SOLN: 4.0000 mg | Freq: Four times a day (QID) | INTRAMUSCULAR | Status: DC | PRN

## 2011-07-23 MED ORDER — NEOSTIGMINE METHYLSULFATE 1 MG/ML IJ SOLN
INTRAMUSCULAR | Status: DC | PRN
  Administered 2011-07-23: 3 mg via INTRAVENOUS

## 2011-07-23 MED ORDER — ONDANSETRON HCL 4 MG/2ML IJ SOLN
INTRAMUSCULAR | Status: DC | PRN
  Administered 2011-07-23: 4 mg via INTRAVENOUS

## 2011-07-23 MED ORDER — LACTATED RINGERS IV SOLN
INTRAVENOUS | Status: DC | PRN
  Administered 2011-07-23: 12:00:00 via INTRAVENOUS

## 2011-07-23 MED ORDER — GLYCOPYRROLATE 0.2 MG/ML IJ SOLN
INTRAMUSCULAR | Status: DC | PRN
  Administered 2011-07-23: .4 mg via INTRAVENOUS

## 2011-07-23 MED ORDER — PROPOFOL 10 MG/ML IV EMUL
INTRAVENOUS | Status: DC | PRN
  Administered 2011-07-23: 90 mg via INTRAVENOUS

## 2011-07-23 MED ORDER — FENTANYL CITRATE 0.05 MG/ML IJ SOLN
INTRAMUSCULAR | Status: DC | PRN
  Administered 2011-07-23 (×3): 50 ug via INTRAVENOUS

## 2011-07-23 MED ORDER — SODIUM CHLORIDE 0.9 % IR SOLN
Status: DC | PRN
  Administered 2011-07-23: 1000 mL

## 2011-07-23 MED ORDER — LABETALOL HCL 5 MG/ML IV SOLN
INTRAVENOUS | Status: DC | PRN
  Administered 2011-07-23: 5 mg via INTRAVENOUS

## 2011-07-23 MED ORDER — MORPHINE SULFATE 2 MG/ML IJ SOLN
0.5000 mg | INTRAMUSCULAR | Status: DC | PRN
  Filled 2011-07-23: qty 1

## 2011-07-23 MED ORDER — ONDANSETRON HCL 4 MG PO TABS: 4.0000 mg | ORAL_TABLET | Freq: Four times a day (QID) | ORAL | Status: DC | PRN

## 2011-07-23 MED ORDER — POTASSIUM CHLORIDE 10 MEQ/100ML IV SOLN
10.0000 meq | INTRAVENOUS | Status: AC
  Administered 2011-07-23 (×3): 10 meq via INTRAVENOUS
  Filled 2011-07-23 (×4): qty 100

## 2011-07-23 MED ORDER — ENOXAPARIN SODIUM 30 MG/0.3ML ~~LOC~~ SOLN
30.0000 mg | Freq: Every day | SUBCUTANEOUS | Status: DC
  Administered 2011-07-24 – 2011-07-27 (×4): 30 mg via SUBCUTANEOUS
  Filled 2011-07-23 (×4): qty 0.3

## 2011-07-23 MED ORDER — ROCURONIUM BROMIDE 100 MG/10ML IV SOLN
INTRAVENOUS | Status: DC | PRN
  Administered 2011-07-23: 30 mg via INTRAVENOUS

## 2011-07-23 MED ORDER — PHENYLEPHRINE HCL 10 MG/ML IJ SOLN
20.0000 mg | INTRAVENOUS | Status: DC | PRN
  Administered 2011-07-23: 50 ug/min via INTRAVENOUS

## 2011-07-23 MED ORDER — BUPIVACAINE HCL (PF) 0.25 % IJ SOLN
INTRAMUSCULAR | Status: DC | PRN
  Administered 2011-07-23: 30 mL

## 2011-07-23 MED ORDER — PHENYLEPHRINE HCL 10 MG/ML IJ SOLN
INTRAMUSCULAR | Status: DC | PRN
  Administered 2011-07-23: 160 ug via INTRAVENOUS

## 2011-07-23 MED ORDER — EPHEDRINE SULFATE 50 MG/ML IJ SOLN
INTRAMUSCULAR | Status: DC | PRN
  Administered 2011-07-23: 10 mg via INTRAVENOUS
  Administered 2011-07-23: 5 mg via INTRAVENOUS

## 2011-07-23 MED ORDER — LACTATED RINGERS IR SOLN
Status: DC | PRN
  Administered 2011-07-23: 1000 mL

## 2011-07-23 MED ORDER — CLINIMIX E/DEXTROSE (5/15) 5 % IV SOLN
INTRAVENOUS | Status: AC
  Administered 2011-07-23: 17:00:00 via INTRAVENOUS
  Filled 2011-07-23: qty 2000

## 2011-07-23 MED ORDER — HYDROMORPHONE HCL PF 1 MG/ML IJ SOLN: 0.2500 mg | INTRAMUSCULAR | Status: DC | PRN

## 2011-07-23 MED ORDER — LIDOCAINE HCL (CARDIAC) 20 MG/ML IV SOLN
INTRAVENOUS | Status: DC | PRN
  Administered 2011-07-23: 60 mg via INTRAVENOUS

## 2011-07-23 MED ORDER — ESMOLOL HCL 10 MG/ML IV SOLN
INTRAVENOUS | Status: DC | PRN
  Administered 2011-07-23 (×3): 10 mg via INTRAVENOUS

## 2011-07-23 SURGICAL SUPPLY — 53 items
ADH SKN CLS APL DERMABOND .7 (GAUZE/BANDAGES/DRESSINGS) ×4
APL SKNCLS STERI-STRIP NONHPOA (GAUZE/BANDAGES/DRESSINGS)
APPLICATOR COTTON TIP 6IN STRL (MISCELLANEOUS) ×3 IMPLANT
BENZOIN TINCTURE PRP APPL 2/3 (GAUZE/BANDAGES/DRESSINGS) IMPLANT
BLADE EXTENDED COATED 6.5IN (ELECTRODE) IMPLANT
BLADE HEX COATED 2.75 (ELECTRODE) ×3 IMPLANT
CANISTER SUCTION 2500CC (MISCELLANEOUS) ×3 IMPLANT
CANNULA ENDOPATH XCEL 11M (ENDOMECHANICALS) IMPLANT
CLOTH BEACON ORANGE TIMEOUT ST (SAFETY) ×3 IMPLANT
COVER MAYO STAND STRL (DRAPES) IMPLANT
DECANTER SPIKE VIAL GLASS SM (MISCELLANEOUS) IMPLANT
DERMABOND ADVANCED (GAUZE/BANDAGES/DRESSINGS) ×2
DERMABOND ADVANCED .7 DNX12 (GAUZE/BANDAGES/DRESSINGS) ×2 IMPLANT
DRAPE LAPAROSCOPIC ABDOMINAL (DRAPES) ×3 IMPLANT
DRAPE WARM FLUID 44X44 (DRAPE) ×2 IMPLANT
ELECT REM PT RETURN 9FT ADLT (ELECTROSURGICAL) ×3
ELECTRODE REM PT RTRN 9FT ADLT (ELECTROSURGICAL) ×2 IMPLANT
GLOVE BIOGEL PI IND STRL 7.0 (GLOVE) ×2 IMPLANT
GLOVE BIOGEL PI INDICATOR 7.0 (GLOVE) ×1
GLOVE SURG SIGNA 7.5 PF LTX (GLOVE) ×3 IMPLANT
GOWN STRL NON-REIN LRG LVL3 (GOWN DISPOSABLE) ×3 IMPLANT
GOWN STRL REIN XL XLG (GOWN DISPOSABLE) ×6 IMPLANT
HAND ACTIVATED (MISCELLANEOUS) IMPLANT
KIT BASIN OR (CUSTOM PROCEDURE TRAY) ×3 IMPLANT
NS IRRIG 1000ML POUR BTL (IV SOLUTION) ×3 IMPLANT
PACK GENERAL/GYN (CUSTOM PROCEDURE TRAY) ×3 IMPLANT
SET IRRIG TUBING LAPAROSCOPIC (IRRIGATION / IRRIGATOR) ×2 IMPLANT
SOLUTION ANTI FOG 6CC (MISCELLANEOUS) ×3 IMPLANT
SPONGE GAUZE 4X4 12PLY (GAUZE/BANDAGES/DRESSINGS) ×3 IMPLANT
SPONGE LAP 18X18 X RAY DECT (DISPOSABLE) IMPLANT
STAPLER VISISTAT 35W (STAPLE) ×3 IMPLANT
STRIP CLOSURE SKIN 1/2X4 (GAUZE/BANDAGES/DRESSINGS) IMPLANT
SUCTION POOLE TIP (SUCTIONS) IMPLANT
SUT PDS AB 1 CTX 36 (SUTURE) IMPLANT
SUT SILK 2 0 (SUTURE)
SUT SILK 2 0 SH CR/8 (SUTURE) IMPLANT
SUT SILK 2-0 18XBRD TIE 12 (SUTURE) IMPLANT
SUT SILK 3 0 (SUTURE)
SUT SILK 3 0 SH CR/8 (SUTURE) IMPLANT
SUT SILK 3-0 18XBRD TIE 12 (SUTURE) IMPLANT
SUT VIC AB 4-0 PS2 27 (SUTURE) IMPLANT
SUT VIC AB 5-0 PS2 18 (SUTURE) ×2 IMPLANT
SUT VICRYL 2 0 18  UND BR (SUTURE)
SUT VICRYL 2 0 18 UND BR (SUTURE) IMPLANT
TOWEL OR 17X26 10 PK STRL BLUE (TOWEL DISPOSABLE) ×6 IMPLANT
TRAY FOLEY CATH 14FRSI W/METER (CATHETERS) IMPLANT
TRAY LAP CHOLE (CUSTOM PROCEDURE TRAY) ×3 IMPLANT
TROCAR BLADELESS OPT 5 75 (ENDOMECHANICALS) IMPLANT
TROCAR XCEL BLUNT TIP 100MML (ENDOMECHANICALS) ×3 IMPLANT
TROCAR XCEL NON-BLD 11X100MML (ENDOMECHANICALS) IMPLANT
TUBING INSUFFLATION 10FT LAP (TUBING) ×3 IMPLANT
WATER STERILE IRR 1500ML POUR (IV SOLUTION) ×1 IMPLANT
YANKAUER SUCT BULB TIP NO VENT (SUCTIONS) IMPLANT
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
## 2011-07-23 NOTE — Transfer of Care (Signed)
Immediate Anesthesia Transfer of Care Note  Patient: Hayley Long  Procedure(s) Performed: Procedure(s) (LRB): LAPAROSCOPY DIAGNOSTIC (N/A)  Patient Location: PACU  Anesthesia Type: General  Level of Consciousness: awake, sedated and patient cooperative  Airway & Oxygen Therapy: Patient Spontanous Breathing and Patient connected to face mask oxygen  Post-op Assessment: Report given to PACU RN, Post -op Vital signs reviewed and stable and Patient moving all extremities  Post vital signs: Reviewed  Complications: No apparent anesthesia complications

## 2011-07-23 NOTE — Progress Notes (Signed)
Spoke with Dr Abbey Chatters, re: clarification of IV fluids order.  Instructed to continue with NS with KCl as ordered by pharmacy.

## 2011-07-23 NOTE — Progress Notes (Signed)
General Surgery Note  LOS: 4 days  POD# --- Room - 1435  Assessment/Plan: 1.  SBO (small bowel obstruction)   Only prior surgery was a cholecystectomy - [place] - 10/2003  K+ is better, though not quite normal  The patient still has vague abdominal pain.  She does have air in her colon, but still has significant SB loops after 4 days of NGT suction.  Reviewed films with Dr. Kandy Garrison.  I discussed going ahead with exploratory laparotomy with patient and her children, the benefits and risks.  The risks include bleeding, infection, open surgery, and recurrent bowel obstruction.  Both her children were at the bedside.  Ms. Righetti does have trouble with chronic laxative use and constipation and the surgery will most likely not benefit this.  2.  Chronic constipation  Takes laxatives frequently at home  3.  Coronary artery disease  Followed by Dr. Elease Hashimoto  Cardiac cath 08/11/2005 = LAD = 80% stenosis at origin, chronic total occlusion LAD with good collateral refill right coronary, ischemia notable for Cardiolite scanning-was high risk for angioplasty and PCI was not performed   Repeat cardiac cath 04/12/2007 = occluded LAD with collateral filling from right coronary artery-stable from prior Cath  4.  Atrial fibrillation  Was on Coumadin, but INR 1.25 - 07/22/2011 5.  Hyperlipidemia  6.  Hypertension    7.  UTI (lower urinary tract infection) - >100,000 Klebsiella pneumonia 8.  Hypokalemia   K+ - 3.4 - 07/23/2011  Discussed with Dr. Venetia Constable who will work to correct this. 9.  On Zosyn 10.  On TPN  Subjective:  Passed small amount of gas.  Symptoms about the same as yesterday. Her daughter, Agustin Cree, and son, Loistine Chance are in the room.   Objective:   Filed Vitals:   07/23/11 0420  BP: 130/83  Pulse: 106  Temp: 98 F (36.7 C)  Resp: 20    Intake/Output from previous day:  06/22 0701 - 06/23 0700 In: 1961.8 [I.V.:502.5; IV Piggyback:350; TPN:1109.3] Out: 2125 [Urine:1975; Emesis/NG  output:150]  Intake/Output this shift:      Physical Exam:   General: Older WF who is alert and oriented.    HEENT: Normal. Pupils equal.  Has NGT in place. .   Lungs: Clear   Abdomen: Soft. Doughy.  No tenderness.  BS present.   Psychiatric: Has had mild confusion.   Lab Results:     Basename 07/23/11 0418 07/22/11 0434  WBC 7.4 8.8  HGB 12.6 13.1  HCT 36.5 36.7  PLT 227 228    BMET    Basename 07/23/11 0418 07/22/11 0434  NA 135 135  K 3.4* 2.9*  CL 99 97  CO2 29 28  GLUCOSE 144* 186*  BUN 13 12  CREATININE 0.53 0.54  CALCIUM 8.9 9.1    PT/INR    Basename 07/22/11 0434  LABPROT 16.0*  INR 1.25    ABG  No results found for this basename: PHART:2,PCO2:2,PO2:2,HCO3:2 in the last 72 hours   Studies/Results:  Dg Abd 1 View  08/13/11  *RADIOLOGY REPORT*  Clinical Data: Ileus, abdominal pain  ABDOMEN - 1 VIEW  Comparison: 07/20/2011  Findings: Prominent air filled loops of small bowel again seen throughout the abdomen with paucity of colonic gas. Findings remain compatible with small bowel obstruction. No definite bowel wall thickening. Surgical clips right upper quadrant question cholecystectomy. Degenerative changes lower lumbar spine.  IMPRESSION: Persistent small bowel dilatation consistent with small bowel obstruction.  Original Report Authenticated By: Redge Gainer.  Tyron Russell, M.D.   Dg Chest Port 1 View  07/21/2011  *RADIOLOGY REPORT*  Clinical Data: PICC line placement.  PORTABLE CHEST - 1 VIEW  Comparison: 03/03/2008 chest radiograph  Findings: A right PICC line is identified with tip overlying the cavoatrial junction. An NG tube is identified coiled in the proximal stomach. Mild peribronchial thickening is again identified. There is no evidence of focal airspace disease, pulmonary edema, suspicious pulmonary nodule/mass, pleural effusion, or pneumothorax. No acute bony abnormalities are identified.  IMPRESSION: Right PICC line with tip at the cavoatrial junction.  NG tube  coiled in the proximal stomach.  Original Report Authenticated By: Rosendo Gros, M.D.   Dg Abd 2 Views  07/22/2011  *RADIOLOGY REPORT*  Clinical Data: Small bowel obstruction follow-up  ABDOMEN - 2 VIEW  Comparison: 07/21/2011  Findings: NG tube is in the stomach.  Small bowel dilatation has improved.  There are small bowel air fluid levels present.  There is gas in the colon which is nondilated.  Negative for pneumoperitoneum.  Degenerative changes at L4-5.  Gallbladder clips are present  IMPRESSION: Small bowel dilatation with air-fluid levels has improved in the interval.  Pattern suggests small bowel obstruction.  Original Report Authenticated By: Camelia Phenes, M.D.     Anti-infectives:   Anti-infectives     Start     Dose/Rate Route Frequency Ordered Stop   07/20/11 2359   vancomycin (VANCOCIN) 500 mg in sodium chloride 0.9 % 100 mL IVPB  Status:  Discontinued        500 mg 100 mL/hr over 60 Minutes Intravenous Every 12 hours 07/20/11 0935 07/21/11 1250   07/19/11 2100  piperacillin-tazobactam (ZOSYN) IVPB 3.375 g       3.375 g 12.5 mL/hr over 240 Minutes Intravenous Every 8 hours 07/19/11 2015     07/19/11 2100   vancomycin (VANCOCIN) 500 mg in sodium chloride 0.9 % 100 mL IVPB  Status:  Discontinued        500 mg 100 mL/hr over 60 Minutes Intravenous Every 24 hours 07/19/11 2016 07/20/11 0935   07/19/11 1800   cefTRIAXone (ROCEPHIN) 1 g in dextrose 5 % 50 mL IVPB        1 g 100 mL/hr over 30 Minutes Intravenous  Once 07/19/11 1749 07/19/11 1831          Ovidio Kin, MD, FACS Pager: [phone],   [facility] Office: [phone] 07/23/2011

## 2011-07-23 NOTE — Addendum Note (Signed)
Addendum  created 07/23/11 1635 by Cloie Wooden A Kingson Lohmeyer, CRNA   Modules edited:Anesthesia Flowsheet, Anesthesia LDA    

## 2011-07-23 NOTE — Preoperative (Addendum)
Beta Blockers   Reason not to administer Beta Blockers:Beta blockers given intraop and postop to maintain heart rate and blood pressure control.

## 2011-07-23 NOTE — Anesthesia Postprocedure Evaluation (Signed)
  Anesthesia Post-op Note  Patient: [place] Paiz Jacinta Shoe  Procedure(s) Performed: Procedure(s) (LRB): LAPAROSCOPY DIAGNOSTIC (N/A)  Patient Location: PACU  Anesthesia Type: General  Level of Consciousness: awake, alert  and patient cooperative  Airway and Oxygen Therapy: Patient Spontanous Breathing and Patient connected to face mask oxygen  Post-op Pain: none  Post-op Assessment: Post-op Vital signs reviewed, Patient's Cardiovascular Status Stable, Respiratory Function Stable, Patent Airway and No signs of Nausea or vomiting  Post-op Vital Signs: Reviewed and stable  Complications: No apparent anesthesia complications

## 2011-07-23 NOTE — Addendum Note (Signed)
Addendum  created 07/23/11 1635 by Elisabeth Cara, CRNA   Modules edited:Anesthesia Flowsheet, Anesthesia LDA

## 2011-07-23 NOTE — Anesthesia Preprocedure Evaluation (Addendum)
Anesthesia Evaluation  Patient identified by MRN, date of birth, ID band Patient awake  General Assessment Comment:Advanced yrs/HOH Increased CV risk Poor historian  Reviewed: Allergy & Precautions, H&P , NPO status , Patient's Chart, lab work & pertinent test results, reviewed documented beta blocker date and time   Airway Mallampati: II TM Distance: >3 FB Neck ROM: Full    Dental  (+) Teeth Intact, Poor Dentition and Dental Advisory Given   Pulmonary neg pulmonary ROS,  breath sounds clear to auscultation        Cardiovascular hypertension, + CAD negative cardio ROS  + dysrhythmias Atrial Fibrillation Rhythm:Irregular Rate:Normal     Neuro/Psych negative neurological ROS  negative psych ROS   GI/Hepatic Neg liver ROS, SBO/surgical abd   Endo/Other  negative endocrine ROS  Renal/GU negative Renal ROS  negative genitourinary   Musculoskeletal negative musculoskeletal ROS (+)   Abdominal   Peds negative pediatric ROS (+)  Hematology negative hematology ROS (+)   Anesthesia Other Findings   Reproductive/Obstetrics negative OB ROS                         Anesthesia Physical Anesthesia Plan  ASA: III and Emergent  Anesthesia Plan: General   Post-op Pain Management:    Induction: Intravenous, Rapid sequence and Cricoid pressure planned  Airway Management Planned: Oral ETT  Additional Equipment:   Intra-op Plan:   Post-operative Plan: Possible Post-op intubation/ventilation  Informed Consent: I have reviewed the patients History and Physical, chart, labs and discussed the procedure including the risks, benefits and alternatives for the proposed anesthesia with the patient or authorized representative who has indicated his/her understanding and acceptance.   Dental advisory given  Plan Discussed with: CRNA and Surgeon  Anesthesia Plan Comments:         Anesthesia Quick  Evaluation
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
## 2011-07-23 NOTE — Brief Op Note (Signed)
07/19/2011 - 07/23/2011  12:50 PM  PATIENT:  Hayley Long, 78 y.o., female, MRN: [id number]  PREOP DIAGNOSIS: small bowel obstruction  POSTOP DIAGNOSIS:   SBO secondary to adhesive band in pelvis.  PROCEDURE:   Procedure(s): LAPAROSCOPY Enterolysis of adhesions.  SURGEON:   Ovidio Kin, M.D.  ASSISTANT:   none  ANESTHESIA:   general  Jill Side, MD - Anesthesiologist Elisabeth Cara, CRNA - CRNA  General  EBL:  minial  ml  BLOOD ADMINISTERED: none  DRAINS: none   LOCAL MEDICATIONS USED:   30 cc 1/4% marcaine  SPECIMEN:   none  COUNTS CORRECT:  YES  INDICATIONS FOR PROCEDURE:  Hayley Long is a 78 y.o. (DOB: 11/14/1920) white female whose primary care physician is Mickie Hillier, MD and comes for laparoscopic exploration for SBO   The indications and risks of the surgery were explained to the patient.  The risks include, but are not limited to, infection, bleeding, and nerve injury.  Note dictated to:   #[id number]

## 2011-07-24 ENCOUNTER — Encounter (HOSPITAL_COMMUNITY): Payer: Self-pay | Admitting: Surgery

## 2011-07-24 DIAGNOSIS — N179 Acute kidney failure, unspecified: Secondary | ICD-10-CM

## 2011-07-24 DIAGNOSIS — R5381 Other malaise: Secondary | ICD-10-CM

## 2011-07-24 DIAGNOSIS — K565 Intestinal adhesions [bands], unspecified as to partial versus complete obstruction: Secondary | ICD-10-CM

## 2011-07-24 DIAGNOSIS — I4891 Unspecified atrial fibrillation: Secondary | ICD-10-CM

## 2011-07-24 DIAGNOSIS — E782 Mixed hyperlipidemia: Secondary | ICD-10-CM

## 2011-07-24 LAB — GLUCOSE, CAPILLARY: Glucose-Capillary: 124 mg/dL — ABNORMAL HIGH (ref 70–99)

## 2011-07-24 LAB — CBC
HCT: 32.5 % — ABNORMAL LOW (ref 36.0–46.0)
Hemoglobin: 10.9 g/dL — ABNORMAL LOW (ref 12.0–15.0)
MCHC: 33.5 g/dL (ref 30.0–36.0)

## 2011-07-24 LAB — DIFFERENTIAL
Basophils Relative: 1 % (ref 0–1)
Lymphocytes Relative: 26 % (ref 12–46)
Lymphs Abs: 1.8 10*3/uL (ref 0.7–4.0)
Monocytes Absolute: 0.7 10*3/uL (ref 0.1–1.0)
Monocytes Relative: 10 % (ref 3–12)
Neutro Abs: 4.1 10*3/uL (ref 1.7–7.7)

## 2011-07-24 LAB — COMPREHENSIVE METABOLIC PANEL
AST: 41 U/L — ABNORMAL HIGH (ref 0–37)
Albumin: 2.6 g/dL — ABNORMAL LOW (ref 3.5–5.2)
Alkaline Phosphatase: 26 U/L — ABNORMAL LOW (ref 39–117)
Chloride: 101 mEq/L (ref 96–112)
Creatinine, Ser: 0.67 mg/dL (ref 0.50–1.10)
Potassium: 3.9 mEq/L (ref 3.5–5.1)
Total Bilirubin: 0.4 mg/dL (ref 0.3–1.2)

## 2011-07-24 LAB — PHOSPHORUS: Phosphorus: 3.2 mg/dL (ref 2.3–4.6)

## 2011-07-24 LAB — PREALBUMIN: Prealbumin: 20 mg/dL (ref 17.0–34.0)

## 2011-07-24 MED ORDER — ZINC TRACE METAL 1 MG/ML IV SOLN
INTRAVENOUS | Status: AC
  Administered 2011-07-24: 17:00:00 via INTRAVENOUS
  Filled 2011-07-24: qty 2000

## 2011-07-24 MED ORDER — FAT EMULSION 20 % IV EMUL
250.0000 mL | INTRAVENOUS | Status: AC
  Administered 2011-07-24: 250 mL via INTRAVENOUS
  Filled 2011-07-24: qty 250

## 2011-07-24 MED ORDER — TRAMADOL HCL 50 MG PO TABS
50.0000 mg | ORAL_TABLET | Freq: Four times a day (QID) | ORAL | Status: DC | PRN
  Administered 2011-07-24: 50 mg via ORAL
  Filled 2011-07-24: qty 1

## 2011-07-24 MED ORDER — ACETAMINOPHEN 325 MG PO TABS
ORAL_TABLET | ORAL | Status: AC
  Filled 2011-07-24: qty 2

## 2011-07-24 MED ORDER — INSULIN ASPART 100 UNIT/ML ~~LOC~~ SOLN: 0.0000 [IU] | Freq: Three times a day (TID) | SUBCUTANEOUS | Status: DC

## 2011-07-24 MED ORDER — INSULIN ASPART 100 UNIT/ML ~~LOC~~ SOLN
0.0000 [IU] | Freq: Three times a day (TID) | SUBCUTANEOUS | Status: DC
  Administered 2011-07-25 (×2): 1 [IU] via SUBCUTANEOUS
  Administered 2011-07-26: 2 [IU] via SUBCUTANEOUS
  Administered 2011-07-26: 1 [IU] via SUBCUTANEOUS

## 2011-07-24 MED ORDER — ACETAMINOPHEN 325 MG PO TABS
650.0000 mg | ORAL_TABLET | Freq: Four times a day (QID) | ORAL | Status: DC | PRN
  Administered 2011-07-24 – 2011-07-27 (×5): 650 mg via ORAL
  Filled 2011-07-24 (×4): qty 2

## 2011-07-24 MED ORDER — BIOTENE DRY MOUTH MT LIQD
15.0000 mL | Freq: Two times a day (BID) | OROMUCOSAL | Status: DC
  Administered 2011-07-24 – 2011-07-25 (×3): 15 mL via OROMUCOSAL

## 2011-07-24 MED ORDER — ACETAMINOPHEN 10 MG/ML IV SOLN
1000.0000 mg | Freq: Once | INTRAVENOUS | Status: AC
  Administered 2011-07-24: 1000 mg via INTRAVENOUS
  Filled 2011-07-24: qty 100

## 2011-07-24 MED ORDER — CHLORHEXIDINE GLUCONATE 0.12 % MT SOLN
15.0000 mL | Freq: Two times a day (BID) | OROMUCOSAL | Status: DC
  Administered 2011-07-24 – 2011-07-26 (×5): 15 mL via OROMUCOSAL
  Filled 2011-07-24 (×7): qty 15

## 2011-07-24 NOTE — Progress Notes (Signed)
1 Day Post-Op  Subjective: Awake and alert. Complains of nausea when she tries to get up.  Objective: Vital signs in last 24 hours: Temp:  [94.4 F (34.7 C)-99.6 F (37.6 C)] 98.6 F (37 C) (06/24 0800) Pulse Rate:  [61-153] 88  (06/24 0400) Resp:  [11-20] 18  (06/24 0400) BP: (88-146)/(52-100) 107/68 mmHg (06/24 0500) SpO2:  [94 %-100 %] 94 % (06/24 0400) Weight:  [135 lb 2.3 oz (61.3 kg)] 135 lb 2.3 oz (61.3 kg) 08/15/22 2000)    Intake/Output from previous day: 2022/08/15 0701 - 06/24 0700 In: 2482.5 [I.V.:1325; IV Piggyback:197.5; TPN:960] Out: 1310 [Urine:1275; Emesis/NG output:25; Blood:10] Intake/Output this shift:    GI: soft with mild tenderness. incisions look ok  Lab Results:   Basename 07/24/11 0428 Aug 15, 2011 0418  WBC 6.9 7.4  HGB 10.9* 12.6  HCT 32.5* 36.5  PLT 205 227   BMET  Basename 07/24/11 0428 2011-08-15 0418  NA 134* 135  K 3.9 3.4*  CL 101 99  CO2 29 29  GLUCOSE 114* 144*  BUN 22 13  CREATININE 0.67 0.53  CALCIUM 8.6 8.9   PT/INR  Basename 07/22/11 0434  LABPROT 16.0*  INR 1.25   ABG No results found for this basename: PHART:2,PCO2:2,PO2:2,HCO3:2 in the last 72 hours  Studies/Results: Dg Abd 2 Views  08/15/2011  *RADIOLOGY REPORT*  Clinical Data: Small bowel obstruction - follow-up.  ABDOMEN - 2 VIEW  Comparison: 07/22/2011 and prior radiographs.  07/19/2011 CT  Findings: Dilated small bowel loops are again noted, compatible with small bowel obstruction. Some gas and stool within the colon is again identified. An NG tube is present with tip overlying the proximal - mid stomach. There is no evidence of pneumoperitoneum. Cholecystectomy clips are identified.  IMPRESSION: Relatively unchanged dilated small bowel loops compatible with small bowel obstruction.  No evidence of pneumoperitoneum.  Original Report Authenticated By: Rosendo Gros, M.D.   Dg Abd 2 Views  07/22/2011  *RADIOLOGY REPORT*  Clinical Data: Small bowel obstruction follow-up   ABDOMEN - 2 VIEW  Comparison: 07/21/2011  Findings: NG tube is in the stomach.  Small bowel dilatation has improved.  There are small bowel air fluid levels present.  There is gas in the colon which is nondilated.  Negative for pneumoperitoneum.  Degenerative changes at L4-5.  Gallbladder clips are present  IMPRESSION: Small bowel dilatation with air-fluid levels has improved in the interval.  Pattern suggests small bowel obstruction.  Original Report Authenticated By: Camelia Phenes, M.D.    Anti-infectives: Anti-infectives     Start     Dose/Rate Route Frequency Ordered Stop   07/20/11 2359   vancomycin (VANCOCIN) 500 mg in sodium chloride 0.9 % 100 mL IVPB  Status:  Discontinued        500 mg 100 mL/hr over 60 Minutes Intravenous Every 12 hours 07/20/11 0935 07/21/11 1250   07/19/11 2100   piperacillin-tazobactam (ZOSYN) IVPB 3.375 g        3.375 g 12.5 mL/hr over 240 Minutes Intravenous Every 8 hours 07/19/11 2015     07/19/11 2100   vancomycin (VANCOCIN) 500 mg in sodium chloride 0.9 % 100 mL IVPB  Status:  Discontinued        500 mg 100 mL/hr over 60 Minutes Intravenous Every 24 hours 07/19/11 2016 07/20/11 0935   07/19/11 1800   cefTRIAXone (ROCEPHIN) 1 g in dextrose 5 % 50 mL IVPB        1 g 100 mL/hr over 30 Minutes Intravenous  Once 07/19/11 1749 07/19/11 1831          Assessment/Plan: s/p Procedure(s) (LRB): LAPAROSCOPY DIAGNOSTIC (N/A) stay with clears until bowel function returns. Continue to monitor in icu Consider rehab consult  LOS: 5 days    TOTH III,Anari Evitt S 07/24/2011

## 2011-07-24 NOTE — Consult Note (Signed)
Physical Medicine and Rehabilitation Consult Reason for Consult: Deconditioning Referring Physician: Dr. Carolynne Edouard   HPI: Hayley Long is a 78 y.o. right-handed female with history of coronary artery disease and atrial fibrillation. Admitted 07/19/2011 with constipation x2 days with associated abdominal pain as well as nausea with green emesis. X-rays and imaging revealed small bowel obstruction. Underwent laparoscopic enterolysis of adhesions 07/23/2011 per Dr. Ezzard Standing . Subcutaneous Lovenox for DVT prophylaxis. Postoperative pain management with Dilaudid. Diet to be slowly advanced from clear liquids. Followup physical and occupational therapy are pending. M.D. is requested physical medicine rehabilitation consult due to deconditioning to consider inpatient rehabilitation services  The patient has not been out of bed since her laparoscopic abdominal surgery yesterday. She is eating clear liquids  Review of Systems  HENT: Positive for hearing loss.   Gastrointestinal: Positive for nausea, vomiting and constipation.  Musculoskeletal: Positive for myalgias.   Past Medical History  Diagnosis Date  . CAD (coronary artery disease)   . Atrial fibrillation   . HTN (hypertension)   . Arthritis   . Carpal tunnel syndrome   . Complication of anesthesia     fights coming out of anethesia   Past Surgical History  Procedure Date  . Eye surgery   . Tonsillectomy   . Cholecystectomy   . Inner ear surgery     bilateral ear surgery - titanium in ears   Family History  Problem Relation Age of Onset  . Heart attack    . Diabetes    . Cancer     Social History:  reports that she has never smoked. She has never used smokeless tobacco. She reports that she does not drink alcohol or use illicit drugs. Allergies:  Allergies  Allergen Reactions  . Corticosteroids Swelling   Medications Prior to Admission  Medication Sig Dispense Refill  . amLODipine (NORVASC) 5 MG tablet Take 5 mg by mouth daily.       . calcium carbonate 200 MG capsule Take 250 mg by mouth 2 (two) times daily with a meal.      . hydrochlorothiazide (HYDRODIURIL) 25 MG tablet Take 25 mg by mouth daily.      . Misc Natural Products (OSTEO BI-FLEX JOINT SHIELD PO) Take by mouth 2 (two) times daily.        . Multiple Vitamin (MULTIVITAMIN) tablet Take 1 tablet by mouth daily.        . nitroGLYCERIN (NITROSTAT) 0.4 MG SL tablet Place 0.4 mg under the tongue every 5 (five) minutes as needed. Chest pain      . potassium chloride (K-DUR,KLOR-CON) 10 MEQ tablet Take 10 mEq by mouth daily.      . valsartan (DIOVAN) 160 MG tablet Take 160 mg by mouth daily.      [name] aspirin 81 MG tablet Take 81 mg by mouth daily.          Home: Home Living Lives With: Son Available Help at Discharge: Family;Available 24 hours/day (Son works from home) Type of Home: House Home Access: Stairs to enter [employer] of Steps: 1 (threshold only) Entrance Stairs-Rails: None Home Layout: One level Bathroom Shower/Tub: Health visitor: Standard Home Adaptive Equipment: Straight cane;Walker - rolling  Functional History: Prior Function Able to Take Stairs?: No Driving: No Comments: Son states pt could walk up/down every isle in grocery store PTA.  Functional Status:  Mobility: Bed Mobility Bed Mobility: Not assessed (Pt in recliner when PT arrived. ) Rolling Left: 4: Min assist Left Sidelying to  Sit: 3: Mod assist;With rails Sitting - Scoot to Edge of Bed: 4: Min assist Transfers Transfers: Sit to Stand;Stand to Sit Sit to Stand: 4: Min assist;3: Mod assist;With upper extremity assist;With armrests Sit to Stand: Patient Percentage: 60% Stand to Sit: 3: Mod assist;With upper extremity assist;To chair/3-in-1 Ambulation/Gait Ambulation/Gait Assistance: 3: Mod assist Ambulation Distance (Feet): 350 Feet Assistive device: Rolling walker Ambulation/Gait Assistance Details: Continues to require 2 person assist for  chair follow and line management.  Max cuing and assist for negotiating around corners and objects.  Continues to want to push RW too far ahead of her and has decreased safety awareness.  Gait Pattern: Step-through pattern;Decreased stride length;Trunk flexed Stairs: No Wheelchair Mobility Wheelchair Mobility: No  ADL:    Cognition: Cognition Arousal/Alertness: Awake/alert Orientation Level: Oriented to person;Oriented to place;Disoriented to situation;Disoriented to time Cognition Overall Cognitive Status: Impaired Area of Impairment: Safety/judgement Arousal/Alertness: Awake/alert Orientation Level: Appears intact for tasks assessed Behavior During Session: [facility] for tasks performed Safety/Judgement: Decreased awareness of safety precautions;Decreased safety judgement for tasks assessed;Decreased awareness of need for assistance;Impulsive  Blood pressure 107/68, pulse 88, temperature 98.6 F (37 C), temperature source Axillary, resp. rate 18, height 5\' 5"  (1.651 m), weight 61.3 kg (135 lb 2.3 oz), SpO2 94.00%. Physical Exam  Constitutional: She is oriented to person, place, and time.  HENT:  Head: Normocephalic.  Neck: Neck supple. No thyromegaly present.  Cardiovascular:       Cardiac rate controlled.  Pulmonary/Chest: Breath sounds normal. She has no wheezes.  Abdominal: She exhibits no distension. There is no tenderness.  Musculoskeletal: She exhibits no edema.  Neurological: She is alert and oriented to person, place, and time.       Patient is hard of hearing  Psychiatric: She has a normal mood and affect.  her strength is 4/5 in bilateral deltoid, biceps, triceps, grip, 3 minus in bilateral hip flexors 4 at bilateral knee extensors and 4 at bilateral ankle dorsiflexors and plantar flexors Sensation is normal in bilateral upper and lower extremities Orientation to person place and time   Results for orders placed during the hospital encounter of 07/19/11 (from the past  24 hour(s))  GLUCOSE, CAPILLARY     Status: Abnormal   Collection Time   07/23/11  2:34 PM      Component Value Range   Glucose-Capillary 211 (*) 70 - 99 mg/dL  MRSA PCR SCREENING     Status: Normal   Collection Time   07/23/11  3:35 PM      Component Value Range   MRSA by PCR NEGATIVE  NEGATIVE  GLUCOSE, CAPILLARY     Status: Abnormal   Collection Time   07/23/11  4:17 PM      Component Value Range   Glucose-Capillary 183 (*) 70 - 99 mg/dL  GLUCOSE, CAPILLARY     Status: Abnormal   Collection Time   07/23/11  7:57 PM      Component Value Range   Glucose-Capillary 169 (*) 70 - 99 mg/dL   Comment 1 Documented in Chart     Comment 2 Notify RN    GLUCOSE, CAPILLARY     Status: Abnormal   Collection Time   07/24/11 12:19 AM      Component Value Range   Glucose-Capillary 143 (*) 70 - 99 mg/dL   Comment 1 Documented in Chart     Comment 2 Notify RN    GLUCOSE, CAPILLARY     Status: Abnormal   Collection Time  07/24/11  3:37 AM      Component Value Range   Glucose-Capillary 110 (*) 70 - 99 mg/dL   Comment 1 Documented in Chart     Comment 2 Notify RN    CHOLESTEROL, TOTAL     Status: Normal   Collection Time   07/24/11  4:27 AM      Component Value Range   Cholesterol 90  0 - 200 mg/dL  TRIGLYCERIDES     Status: Normal   Collection Time   07/24/11  4:27 AM      Component Value Range   Triglycerides 58  <150 mg/dL  COMPREHENSIVE METABOLIC PANEL     Status: Abnormal   Collection Time   07/24/11  4:28 AM      Component Value Range   Sodium 134 (*) 135 - 145 mEq/L   Potassium 3.9  3.5 - 5.1 mEq/L   Chloride 101  96 - 112 mEq/L   CO2 29  19 - 32 mEq/L   Glucose, Bld 114 (*) 70 - 99 mg/dL   BUN 22  6 - 23 mg/dL   Creatinine, Ser 1.61  0.50 - 1.10 mg/dL   Calcium 8.6  8.4 - 09.6 mg/dL   Total Protein 5.2 (*) 6.0 - 8.3 g/dL   Albumin 2.6 (*) 3.5 - 5.2 g/dL   AST 41 (*) 0 - 37 U/L   ALT 34  0 - 35 U/L   Alkaline Phosphatase 26 (*) 39 - 117 U/L   Total Bilirubin 0.4  0.3 - 1.2  mg/dL   GFR calc non Af Amer 76 (*) >90 mL/min   GFR calc Af Amer 88 (*) >90 mL/min  MAGNESIUM     Status: Normal   Collection Time   07/24/11  4:28 AM      Component Value Range   Magnesium 2.1  1.5 - 2.5 mg/dL  PHOSPHORUS     Status: Normal   Collection Time   07/24/11  4:28 AM      Component Value Range   Phosphorus 3.2  2.3 - 4.6 mg/dL  CBC     Status: Abnormal   Collection Time   07/24/11  4:28 AM      Component Value Range   WBC 6.9  4.0 - 10.5 K/uL   RBC 3.57 (*) 3.87 - 5.11 MIL/uL   Hemoglobin 10.9 (*) 12.0 - 15.0 g/dL   HCT 04.5 (*) 40.9 - 81.1 %   MCV 91.0  78.0 - 100.0 fL   MCH 30.5  26.0 - 34.0 pg   MCHC 33.5  30.0 - 36.0 g/dL   RDW 91.4  78.2 - 95.6 %   Platelets 205  150 - 400 K/uL  DIFFERENTIAL     Status: Normal   Collection Time   07/24/11  4:28 AM      Component Value Range   Neutrophils Relative 59  43 - 77 %   Neutro Abs 4.1  1.7 - 7.7 K/uL   Lymphocytes Relative 26  12 - 46 %   Lymphs Abs 1.8  0.7 - 4.0 K/uL   Monocytes Relative 10  3 - 12 %   Monocytes Absolute 0.7  0.1 - 1.0 K/uL   Eosinophils Relative 4  0 - 5 %   Eosinophils Absolute 0.3  0.0 - 0.7 K/uL   Basophils Relative 1  0 - 1 %   Basophils Absolute 0.1  0.0 - 0.1 K/uL  GLUCOSE, CAPILLARY     Status: Abnormal  Collection Time   07/24/11  7:33 AM      Component Value Range   Glucose-Capillary 124 (*) 70 - 99 mg/dL   Comment 1 Documented in Chart     Comment 2 Notify RN     Dg Abd 2 Views  07/23/2011  *RADIOLOGY REPORT*  Clinical Data: Small bowel obstruction - follow-up.  ABDOMEN - 2 VIEW  Comparison: 07/22/2011 and prior radiographs.  07/19/2011 CT  Findings: Dilated small bowel loops are again noted, compatible with small bowel obstruction. Some gas and stool within the colon is again identified. An NG tube is present with tip overlying the proximal - mid stomach. There is no evidence of pneumoperitoneum. Cholecystectomy clips are identified.  IMPRESSION: Relatively unchanged dilated small  bowel loops compatible with small bowel obstruction.  No evidence of pneumoperitoneum.  Original Report Authenticated By: Rosendo Gros, M.D.   Dg Abd 2 Views  07/22/2011  *RADIOLOGY REPORT*  Clinical Data: Small bowel obstruction follow-up  ABDOMEN - 2 VIEW  Comparison: 07/21/2011  Findings: NG tube is in the stomach.  Small bowel dilatation has improved.  There are small bowel air fluid levels present.  There is gas in the colon which is nondilated.  Negative for pneumoperitoneum.  Degenerative changes at L4-5.  Gallbladder clips are present  IMPRESSION: Small bowel dilatation with air-fluid levels has improved in the interval.  Pattern suggests small bowel obstruction.  Original Report Authenticated By: Camelia Phenes, M.D.    Assessment/Plan: Diagnosis: deconditioning after small bowel obstruction post operative day #1 status post laparoscopic guided lysis of adhesions 1. Does the need for close, 24 hr/day medical supervision in concert with the patient's rehab needs make it unreasonable for this patient to be served in a less intensive setting? Yes 2. Co-Morbidities requiring supervision/potential complications: atrial fibrillation, hypertension, recent small bowel extraction, coronary artery disease 3. Due to bladder management, bowel management, safety, skin/wound care, disease management, medication administration, pain management and patient education, does the patient require 24 hr/day rehab nursing? Potentially 4. Does the patient require coordinated care of a physician, rehab nurse, PT (1-2 hrs/day, 5 days/week) and OT (1-2 hrs/day, 5 days/week) to address physical and functional deficits in the context of the above medical diagnosis(es)? Potentially Addressing deficits in the following areas: balance, endurance, locomotion, strength, transferring, bathing, dressing and toileting 5. Can the patient actively participate in an intensive therapy program of at least 3 hrs of therapy per day at  least 5 days per week? Potentially 6. The potential for patient to make measurable gains while on inpatient rehab is good 7. Anticipated functional outcomes upon discharge from inpatient rehab are supervision mobility with PT, supervision ADLs with OT, aapplicable with SLP. 8. Estimated rehab length of stay to reach the above functional goals is: 7-10 days 9. Does the patient have adequate social supports to accommodate these discharge functional goals? Potentially 10. Anticipated D/C setting: Home 11. Anticipated post D/C treatments: HH therapy 12. Overall Rehab/Functional Prognosis: good  RECOMMENDATIONS: This patient's condition is appropriate for continued rehabilitative care in the following setting: rehabilitation RN to followup on therapy progress on postoperative day #2. If still requiring physical assistance then I would rec CIR admission Patient has agreed to participate in recommended program. Potentially Note that insurance prior authorization may be required for reimbursement for recommended care.  Comment:    07/24/2011

## 2011-07-24 NOTE — Op Note (Signed)
NAMEHEILEY, SHAIKH NO.:  [id number]  MEDICAL RECORD NO.:  [id number]  LOCATION:  1222                         FACILITY:  [facility]  PHYSICIAN:  Sandria Bales. Ezzard Standing, M.D.  DATE OF BIRTH:  [birth year]  DATE OF PROCEDURE:  07/23/2011 DATE OF DISCHARGE:                              OPERATIVE REPORT   PREOPERATIVE DIAGNOSIS:  Small-bowel obstruction.  POSTOPERATIVE DIAGNOSIS:  Small-bowel obstruction secondary to adhesive band in pelvis.  PROCEDURE:  Laparoscopic enterolysis of adhesions.  SURGEON:  Sandria Bales. Ezzard Standing, MD  No first assistant.  ANESTHESIA:  General.  ESTIMATED BLOOD LOSS:  Minimal.  Local use was 30 mL of 0.25% Marcaine.  INDICATION FOR PROCEDURE:  Ms. Konecny is an 78 year old white female, who sees Dr. Catha Gosselin as her primary care doctor.  She has been hospitalized since July 19, 2011, with a bowel obstruction which has not improved with medical management.  A discussion was carried out with the patient about proceeding with laparoscopic exploration.  I discussed the indications, potential complications of surgery.  Potential complications include, but are not limited to, bleeding, infection, the need for open surgery, and possible bowel resection.  OPERATIVE NOTE:  The patient was placed in a supine position in room #1 and Dr. Lestine Box is her supervising anesthesiologist.  Her abdomen was prepped with ChloraPrep and sterilely draped.    A time-out was held in the surgical checklist run.  The abdomen was prepped with chloroprep and sterilely dressed. I accessed the abdominal cavity through an infraumbilical incision with sharp dissection carried down in the abdominal cavity.  A 0 degree, 10 mm laparoscope was inserted.  I placed two additional trocars: a 5 mm left upper quadrant and a 5 mm left lower quadrant.  I did a laparoscopic abdominal exploration.  Right and left lobes of liver were unremarkable.  The stomach that I could see was  unremarkable.  The small bowel was dilated up to a point located in the pelvis and I could see decompressed bowel more distally going into the cecum.  I ran the small bowel backwards, starting at the terminal ileum and going proximally.  I found a single adhesive band across the small bowel which created a closed loop obstruction of the small bowel about 4-5 feet from the terminal ileum/cecum.  I used scissors to cut this band, this then released her obstruction.  I then ran the bowel back until the bowel was dilated and saw nothing further more proximal.  I did look in the pelvis at the right and left ovaries, which were unremarkable.  The fallopian tubes were unremarkable.  The uterus was unremarkable and there was no evidence of any tumor, mass, or growth in the pelvis.  Once I completed the operation, I then removed the umbilical port, closed it with 0-Vicryl suture, closed the skin at each port with 5-0 Vicryl suture, painted the wounds with Dermabond.  I injected about 30 mL of 0.25% Marcaine into the wounds.  The patient was transported to the recovery room in good condition.  [Note:  In the recovery room, she developed a tachycardia.  So I sent her to step down ICU post op.]  Sandria Bales. Ezzard Standing, M.D., FACS   DHN/MEDQ  D:  07/23/2011  T:  07/23/2011  Job:  [id number]  cc:   Caryn Bee L. Little, M.D. Fax: [phone]  Vesta Mixer, M.D. Fax: [phone]

## 2011-07-24 NOTE — Progress Notes (Signed)
PARENTERAL NUTRITION CONSULT NOTE - FOLLOW UP  Pharmacy Consult for TNA Indication: Prolonged ileus vs SBO  Allergies  Allergen Reactions  . Corticosteroids Swelling   Patient Measurements: Height: 5\' 5"  (165.1 cm) Weight: 135 lb 2.3 oz (61.3 kg) IBW/kg (Calculated) : 57   Vital Signs: Temp: 99.6 F (37.6 C) (06/24 0400) Temp src: Oral (06/24 0400) BP: 107/68 mmHg (06/24 0500) Pulse Rate: 88  (06/24 0400) Intake/Output from previous day: 06/23 0701 - 06/24 0700 In: 2482.5 [I.V.:1325; IV Piggyback:197.5; TPN:960] Out: 1310 [Urine:1275; Emesis/NG output:25; Blood:10] Intake/Output from this shift:    Labs:  [facility] 07/24/11 0428 07/23/11 0418 07/22/11 0434  WBC 6.9 7.4 8.8  HGB 10.9* 12.6 13.1  HCT 32.5* 36.5 36.7  PLT 205 227 228  APTT -- -- --  INR -- -- 1.25    Basename 07/24/11 0428 07/23/11 0418 07/22/11 0434  NA 134* 135 135  K 3.9 3.4* 2.9*  CL 101 99 97  CO2 29 29 28   GLUCOSE 114* 144* 186*  BUN 22 13 12   CREATININE 0.67 0.53 0.54  LABCREA -- -- --  CREAT24HRUR -- -- --  CALCIUM 8.6 8.9 9.1  MG 2.1 2.3 1.9  PHOS 3.2 2.5 1.4*  PROT 5.2* 6.1 6.3  ALBUMIN 2.6* 3.1* 3.2*  AST 41* 57* 20  ALT 34 37* 9  ALKPHOS 26* 29* 28*  BILITOT 0.4 0.6 0.5  BILIDIR -- -- --  IBILI -- -- --  PREALBUMIN -- -- 19.9  TRIG -- -- 114  CHOLHDL -- -- --  CHOL -- -- 131   Estimated Creatinine Clearance: 43.7 ml/min (by C-G formula based on Cr of 0.67).    Basename 07/24/11 0337 07/24/11 0019 07/23/11 1957  GLUCAP 110* 143* 169*   Medications:  Scheduled:    . acetaminophen  1,000 mg Intravenous Q6H  . acetaminophen  1,000 mg Intravenous Once  . antiseptic oral rinse  15 mL Mouth Rinse q12n4p  . chlorhexidine  15 mL Mouth Rinse BID  . enoxaparin  30 mg Subcutaneous Daily  . insulin aspart  0-9 Units Subcutaneous Q4H  . pantoprazole (PROTONIX) IV  40 mg Intravenous Q24H  . piperacillin-tazobactam (ZOSYN)  IV  3.375 g Intravenous Q8H  . potassium chloride  10  mEq Intravenous Q1 Hr x 4  . sodium chloride  3 mL Intravenous Q12H  . sodium phosphate  1 enema Rectal Once  . DISCONTD: aspirin  81 mg Oral Daily   Infusions:    . 0.9 % NaCl with KCl 20 mEq / L 50 mL (07/23/11 1758)  . TPN (CLINIMIX) +/- additives 60 mL/hr (07/23/11 1146)  . TPN (CLINIMIX) +/- additives 60 mL/hr at 07/23/11 1800   Nutritional Goals: (per 6/20 RD note) Kcal: 1220-1440. Protein: 66-78 grams. Fluid: 1 ml per kcal intake. Clinimix 5/15 at 64ml/hr 20% fat emulsion at 54ml/hr MWF only provides 72g protein, 1502 kcal on lipid days, 1022 kcal on non-lipid days, for an average of 1228 kcal/day.  Current nutrition: Clinimix E 5/15 at 42ml/hr + 20% fat emulsion at 13ml/hr on MWF only. NPO.  CBGs & Insulin requirements past 24 hours:  110-211. 9 units SSI.   IVF: NS 20K at 47ml/hr.  Labs:   Electrolytes - wnl except Na slightly low. K improved after 4 runs yest.  LFTs - improved  TGs - 114(6/22)  Prealbumin - 19.9(6/22)  Assessment:   78 yo F admitted 07/19/11 with SBO vs ileus. TNA started 6/21pm for prolonged ileus.  Tolerating TNA  at goal.  Glucose coming under control.  Protonix IV.  Plan:  Continue goal rate Clinimix E 5/15% of 71ml/hr.  20% lipid emulsion at 52ml/hr on MWF only due to ongoing national backorder.   MVI and trace elements on MWF only due to ongoing national backorder.  Cont IVF per MD of NS with 20 mEq/L KCL at 50 ml/hr.   Continue SSI (sens scale) and CBGs q4h.  Charolotte Eke, PharmD, pager [phone]. 07/24/2011,7:33 AM.

## 2011-07-24 NOTE — Progress Notes (Signed)
Physical Therapy Treatment Patient Details Name: Hayley Long MRN: [id number] DOB: 25-Feb-1920 Today's Date: 07/24/2011 Time: 8657-8469 PT Time Calculation (min): 25 min  PT Assessment / Plan / Recommendation Comments on Treatment Session  Pt progressing well.  Amb full unit + 2 assist for safety.    Follow Up Recommendations  Inpatient Rehab;Skilled nursing facility;Supervision/Assistance - 24 hour;Other (comment) (pending progress)    Barriers to Discharge        Equipment Recommendations  Defer to next venue    Recommendations for Other Services    Frequency     Plan Discharge plan remains appropriate    Precautions / Restrictions     Pertinent Vitals/Pain     Mobility  Bed Mobility Bed Mobility: Not assessed Details for Bed Mobility Assistance: Pt OOB in recliner Transfers Transfers: Sit to Stand;Stand to Sit Sit to Stand: 1: +2 Total assist;From chair/3-in-1 Sit to Stand: Patient Percentage: 80% Stand to Sit: 1: +2 Total assist;To chair/3-in-1 Stand to Sit: Patient Percentage: 80% Details for Transfer Assistance: 50% VC's on hand placement and turn completion prior to sit Ambulation/Gait Ambulation/Gait Assistance: 1: +2 Total assist Ambulation/Gait: Patient Percentage: 90% Ambulation Distance (Feet): 350 Feet Assistive device: Rolling walker Ambulation/Gait Assistance Details: + 2 assist for safety and equuipmenrt (IV/chair)  50% VC's on proper walker to self distance and safety negociating around corners. Gait Pattern: Step-through pattern;Decreased stride length;Trunk flexed Gait velocity: decreased    Exercises     PT Diagnosis:    PT Problem List:   PT Treatment Interventions:     PT Goals    Visit Information  Last PT Received On: 07/24/11 Assistance Needed: +2                   End of Session PT - End of Session Equipment Utilized During Treatment: Gait belt Activity Tolerance: Patient tolerated treatment well Patient left: in  chair;with call bell/phone within reach;with family/visitor present   Felecia Shelling  PTA [facility]  Acute  Rehab Pager     [phone]

## 2011-07-24 NOTE — Clinical Social Work Psychosocial (Signed)
Clinical Social Work Department BRIEF PSYCHOSOCIAL ASSESSMENT 07/24/2011  Patient:  Hayley Long, Hayley Long     Account Number:  [id number]     Admit date:  07/19/2011  Clinical Social Worker:  Jodelle Red  Date/Time:  07/24/2011 11:41 AM  Referred by:  CSW  Date Referred:  07/24/2011 Referred for  SNF Placement   Other Referral:   Interview type:  Family Other interview type:   CHART REVIEW, DISCUSSION WITH RN    PSYCHOSOCIAL DATA Living Status:  WITH ADULT CHILDREN Admitted from facility:   Level of care:   Primary support name:  PHILLIP Gladd Primary support relationship to patient:  CHILD, ADULT Degree of support available:   GOOD. SPOKE WITH DAUGHTER, DARLENE AS SON WENT HOME TO SLEEP.    CURRENT CONCERNS Current Concerns  Post-Acute Placement  Adjustment to Illness   Other Concerns:    SOCIAL WORK ASSESSMENT / PLAN CSW SPOKE WITH DAUGHTER, PT ASLEEP AND SON WENT HOME TO REST. PT LIVES WITH SON WHO WORKS FROM HOME. PER DAUGHTER, PT HAS BECOME DIFFICULT TO CARE FOR IN THE LAST SEVERAL WEEKS AND HAS BEEN COMBATIVE. THEY ARE NOT SURE IF THIS IS DUE TO PAIN FROM MEDICAL CONDITION OR SOME OTHER CHANGE. PT ASSESSED PRIOR TO SURGERY AND PT COULD WALK 350 FEET. CSW EXPLAINED BLUE MEDICARE MOST LIKELY WOULD NOT COVER SNF FOR PT UNLESS THEY REASSESS. CSW PROVIDED LIST OF SNFS AND EXPLAINED PROCESS FOR SNF. DAUGHTER WILL SHARE INFO WITH HER BROTHER AND CSW WILL BEGIN SNF PROCESS. NEED PT/OT TO DO FL2. CSW TO FOLLOW.   Assessment/plan status:  FOLLOW FOR POSSIBLE SNF Other assessment/ plan:   Information/referral to community resources:  SNF LIST PROVIDED  PATIENT'S/FAMILY'S RESPONSE TO PLAN OF CARE: DAUGHTER CONCERNED, APPRECIATES CSW ASSISTANCE. CSW TO FOLLOW AND DISCUSS ISSUE WITH SON.  Vennie Homans, [place] 07/24/2011 11:48 AM [phone]
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
## 2011-07-25 DIAGNOSIS — I4891 Unspecified atrial fibrillation: Secondary | ICD-10-CM

## 2011-07-25 DIAGNOSIS — E782 Mixed hyperlipidemia: Secondary | ICD-10-CM

## 2011-07-25 DIAGNOSIS — N179 Acute kidney failure, unspecified: Secondary | ICD-10-CM

## 2011-07-25 DIAGNOSIS — K565 Intestinal adhesions [bands], unspecified as to partial versus complete obstruction: Secondary | ICD-10-CM

## 2011-07-25 LAB — COMPREHENSIVE METABOLIC PANEL
AST: 24 U/L (ref 0–37)
Alkaline Phosphatase: 30 U/L — ABNORMAL LOW (ref 39–117)
BUN: 19 mg/dL (ref 6–23)
CO2: 28 mEq/L (ref 19–32)
Chloride: 103 mEq/L (ref 96–112)
Creatinine, Ser: 0.65 mg/dL (ref 0.50–1.10)
GFR calc non Af Amer: 77 mL/min — ABNORMAL LOW (ref >90)
Potassium: 3.8 mEq/L (ref 3.5–5.1)
Total Bilirubin: 0.3 mg/dL (ref 0.3–1.2)

## 2011-07-25 LAB — CBC
HCT: 32.7 % — ABNORMAL LOW (ref 36.0–46.0)
MCV: 90.1 fL (ref 78.0–100.0)
Platelets: 222 10*3/uL (ref 150–400)
RBC: 3.63 MIL/uL — ABNORMAL LOW (ref 3.87–5.11)
WBC: 7.8 10*3/uL (ref 4.0–10.5)

## 2011-07-25 LAB — GLUCOSE, CAPILLARY
Glucose-Capillary: 127 mg/dL — ABNORMAL HIGH (ref 70–99)
Glucose-Capillary: 137 mg/dL — ABNORMAL HIGH (ref 70–99)

## 2011-07-25 LAB — PHOSPHORUS: Phosphorus: 3.3 mg/dL (ref 2.3–4.6)

## 2011-07-25 MED ORDER — AMLODIPINE BESYLATE 5 MG PO TABS
5.0000 mg | ORAL_TABLET | Freq: Every day | ORAL | Status: DC
  Administered 2011-07-25 – 2011-07-27 (×3): 5 mg via ORAL
  Filled 2011-07-25 (×3): qty 1

## 2011-07-25 MED ORDER — ADULT MULTIVITAMIN W/MINERALS CH
1.0000 | ORAL_TABLET | Freq: Every day | ORAL | Status: DC
  Administered 2011-07-25 – 2011-07-27 (×3): 1 via ORAL
  Filled 2011-07-25 (×3): qty 1

## 2011-07-25 MED ORDER — CLINIMIX E/DEXTROSE (5/15) 5 % IV SOLN
INTRAVENOUS | Status: AC
  Administered 2011-07-25: 18:00:00 via INTRAVENOUS
  Filled 2011-07-25: qty 2000

## 2011-07-25 MED ORDER — ASPIRIN EC 81 MG PO TBEC
81.0000 mg | DELAYED_RELEASE_TABLET | Freq: Every day | ORAL | Status: DC
  Administered 2011-07-25 – 2011-07-27 (×3): 81 mg via ORAL
  Filled 2011-07-25 (×3): qty 1

## 2011-07-25 MED ORDER — NITROGLYCERIN 0.4 MG SL SUBL: 0.4000 mg | SUBLINGUAL_TABLET | SUBLINGUAL | Status: DC | PRN
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
## 2011-07-25 NOTE — Progress Notes (Signed)
Physical Therapy Treatment Patient Details Name: Hayley Long MRN: [id number] DOB: 28-Aug-1920 Today's Date: 07/25/2011 Time: 4098-1191 PT Time Calculation (min): 25 min  PT Assessment / Plan / Recommendation Comments on Treatment Session  Pt stated she does not feel as well as yesterday.  Amb 1/2 unit.  Daughter present and assisted by following with recliner.  Pt plans to D/C to SNF if son agrees.                            Frequency Min 3X/week   Plan Discharge plan remains appropriate    Precautions / Restrictions Restrictions Weight Bearing Restrictions: No        Mobility  Bed Mobility Bed Mobility: Not assessed Details for Bed Mobility Assistance: Pt OOB in recliner Transfers Transfers: Sit to Stand;Stand to Sit Sit to Stand: 4: Min assist Stand to Sit: 4: Min assist Details for Transfer Assistance: 50% VC's on hand placement esp with stand to sit as pt tends to sit quickly due to fatigue Ambulation/Gait Ambulation/Gait Assistance: 1: +2 Total assist Ambulation/Gait: Patient Percentage: 90% Ambulation Distance (Feet): 175 Feet Assistive device: Rolling walker Ambulation/Gait Assistance Details: decreased amb distance this am due to increased c/o "not feeling as well". @nd  person needed to follow with chair. Unsteady gait with 25% VC's on proper walker to self distance. Gait Pattern: Step-through pattern;Decreased stride length;Trunk flexed Gait velocity: decreased General Gait Details: HR 109 - 130, RA 90 - 96%     PT Goals   progressing    Visit Information  Last PT Received On: 07/25/11 Assistance Needed: +2                   End of Session PT - End of Session Equipment Utilized During Treatment: Gait belt Activity Tolerance: Patient limited by fatigue Patient left: in chair;with call bell/phone within reach;with family/visitor present Nurse Communication: Other (comment) (RN observed pt amb in hallway)   Felecia Shelling  PTA [facility]  Acute   Rehab Pager     [phone]

## 2011-07-25 NOTE — Progress Notes (Signed)
PARENTERAL NUTRITION CONSULT NOTE - FOLLOW UP  Pharmacy Consult for TNA Indication: Prolonged ileus vs SBO  Allergies  Allergen Reactions  . Corticosteroids Swelling   Patient Measurements: Height: 5\' 5"  (165.1 cm) Weight: 139 lb 15.9 oz (63.5 kg) IBW/kg (Calculated) : 57   Vital Signs: Temp: 98.4 F (36.9 C) (06/25 0400) Temp src: Oral (06/25 0400) BP: 130/72 mmHg (06/25 0000) Pulse Rate: 72  (06/25 0600) Intake/Output from previous day: 06/24 0701 - 06/25 0700 In: 3001.5 [P.O.:270; I.V.:1150; IV Piggyback:122.5; TPN:1459] Out: 2090 [Urine:2090] Intake/Output from this shift:    Labs:  [facility] 07/25/11 0450 07/24/11 0428 07/23/11 0418  WBC 7.8 6.9 7.4  HGB 11.2* 10.9* 12.6  HCT 32.7* 32.5* 36.5  PLT 222 205 227  APTT -- -- --  INR -- -- --    Basename 07/25/11 0450 07/24/11 0428 07/24/11 0427 07/23/11 0418  NA 136 134* -- 135  K 3.8 3.9 -- 3.4*  CL 103 101 -- 99  CO2 28 29 -- 29  GLUCOSE 123* 114* -- 144*  BUN 19 22 -- 13  CREATININE 0.65 0.67 -- 0.53  LABCREA -- -- -- --  CREAT24HRUR -- -- -- --  CALCIUM 8.9 8.6 -- 8.9  MG 2.1 2.1 -- 2.3  PHOS 3.3 3.2 -- 2.5  PROT 5.4* 5.2* -- 6.1  ALBUMIN 2.7* 2.6* -- 3.1*  AST 24 41* -- 57*  ALT 26 34 -- 37*  ALKPHOS 30* 26* -- 29*  BILITOT 0.3 0.4 -- 0.6  BILIDIR -- -- -- --  IBILI -- -- -- --  PREALBUMIN -- 20.0 -- --  TRIG -- -- 58 --  CHOLHDL -- -- -- --  CHOL -- -- 90 --   Estimated Creatinine Clearance: 43.7 ml/min (by C-G formula based on Cr of 0.65).    Basename 07/25/11 0724 07/24/11 2207 07/24/11 1951  GLUCAP 127* 123* 135*   Medications:  Scheduled:     . acetaminophen      . antiseptic oral rinse  15 mL Mouth Rinse q12n4p  . chlorhexidine  15 mL Mouth Rinse BID  . enoxaparin  30 mg Subcutaneous Daily  . insulin aspart  0-9 Units Subcutaneous TID WC  . pantoprazole (PROTONIX) IV  40 mg Intravenous Q24H  . piperacillin-tazobactam (ZOSYN)  IV  3.375 g Intravenous Q8H  . sodium chloride  3  mL Intravenous Q12H  . DISCONTD: insulin aspart  0-9 Units Subcutaneous Q4H  . DISCONTD: insulin aspart  0-9 Units Subcutaneous TID WC & HS  . DISCONTD: sodium phosphate  1 enema Rectal Once   Infusions:     . 0.9 % NaCl with KCl 20 mEq / L 50 mL (07/23/11 1758)  . TPN (CLINIMIX) +/- additives 60 mL/hr at 07/25/11 0700   And  . fat emulsion 500 kcal (07/25/11 0700)  . TPN (CLINIMIX) +/- additives 60 mL/hr at 07/23/11 1800   Nutritional Goals: (per 6/20 RD note) Kcal: 1220-1440. Protein: 66-78 grams. Fluid: 1 ml per kcal intake. Clinimix 5/15 at 65ml/hr 20% fat emulsion at 36ml/hr MWF only provides 72g protein, 1502 kcal on lipid days, 1022 kcal on non-lipid days, for an average of 1228 kcal/day.  Current nutrition: Clinimix E 5/15 at 52ml/hr + 20% fat emulsion at 27ml/hr on MWF only. CL diet.  CBGs & Insulin requirements past 24 hours:  118-137. 2 units SSI.   IVF: NS 20K at 59ml/hr.  Labs:   Electrolytes - wnl.  LFTs - wnl.  TGs - wnl.  Prealbumin -  20.  Assessment:   78 yo F admitted 07/19/11 with SBO vs ileus. TNA started 6/21pm for prolonged ileus.  Slow progress with transition to enteral diet d/t nausea.  Tolerating TNA at goal.  Glucose controlled.  Protonix IV.  Plan:  Continue goal rate Clinimix E 5/15 of 50ml/hr.  20% lipid emulsion at 68ml/hr on MWF only due to ongoing national backorder.   MVI and trace elements on MWF only due to ongoing national backorder.  Cont IVF per MD of NS with 20 mEq/L KCL at 50 ml/hr.   Continue SSI (sens scale), change to ACHS.  Charolotte Eke, PharmD, pager [phone]. 07/25/2011,8:11 AM.

## 2011-07-25 NOTE — Progress Notes (Signed)
Utilization review completed.  

## 2011-07-25 NOTE — Progress Notes (Signed)
2 Days Post-Op  Subjective: Up in chair, passing gas, she's very alert today.  Oriented also.  Objective: Vital signs in last 24 hours: Temp:  [97.4 F (36.3 C)-98.6 F (37 C)] 98.4 F (36.9 C) (06/25 0400) Pulse Rate:  [72-99] 86  (06/25 0800) Resp:  [12-22] 15  (06/25 0800) BP: (104-140)/(40-78) 140/77 mmHg (06/25 0750) SpO2:  [93 %-98 %] 97 % (06/25 0800) Weight:  [63.5 kg (139 lb 15.9 oz)] 63.5 kg (139 lb 15.9 oz) (06/25 0000)    TNA, clears, 270 PO recorded yesterday, afebrile, VSS, labs OK,  Intake/Output from previous day: 06/24 0701 - 06/25 0700 In: 3001.5 [P.O.:270; I.V.:1150; IV Piggyback:122.5; TPN:1459] Out: 2090 [Urine:2090] Intake/Output this shift: Total I/O In: 132.5 [I.V.:50; IV Piggyback:12.5; TPN:70] Out: 200 [Urine:200]  General appearance: alert, cooperative and no distress Resp: clear to auscultation bilaterally GI: soft, +BS, +flatus, wound looks great, no distension.  Lab Results:   [facility] 07/25/11 0450 07/24/11 0428  WBC 7.8 6.9  HGB 11.2* 10.9*  HCT 32.7* 32.5*  PLT 222 205    BMET  Basename 07/25/11 0450 07/24/11 0428  NA 136 134*  K 3.8 3.9  CL 103 101  CO2 28 29  GLUCOSE 123* 114*  BUN 19 22  CREATININE 0.65 0.67  CALCIUM 8.9 8.6   PT/INR No results found for this basename: LABPROT:2,INR:2 in the last 72 hours   Lab 07/25/11 0450 07/24/11 0428 07/23/11 0418 07/22/11 0434 07/21/11 0410  AST 24 41* 57* 20 21  ALT 26 34 37* 9 9  ALKPHOS 30* 26* 29* 28* 28*  BILITOT 0.3 0.4 0.6 0.5 0.6  PROT 5.4* 5.2* 6.1 6.3 6.3  ALBUMIN 2.7* 2.6* 3.1* 3.2* 3.3*     Lipase     Component Value Date/Time   LIPASE 56 07/19/2011 1600     Studies/Results: No results found.  Medications:    . acetaminophen      . antiseptic oral rinse  15 mL Mouth Rinse q12n4p  . chlorhexidine  15 mL Mouth Rinse BID  . enoxaparin  30 mg Subcutaneous Daily  . insulin aspart  0-9 Units Subcutaneous TID WC  . pantoprazole (PROTONIX) IV  40 mg  Intravenous Q24H  . piperacillin-tazobactam (ZOSYN)  IV  3.375 g Intravenous Q8H  . sodium chloride  3 mL Intravenous Q12H  . DISCONTD: insulin aspart  0-9 Units Subcutaneous Q4H  . DISCONTD: insulin aspart  0-9 Units Subcutaneous TID WC & HS  . DISCONTD: sodium phosphate  1 enema Rectal Once    Assessment/Plan SBO secondary to adhesive band in pelvis. S/p LAPAROSCOPY Enterolysis of adhesions 07/23/11 Dr. Ezzard Standing SBO vs ileus  Probable UTI  Chronic constipation, takes laxatives frequently at home. MG Citrate works best.  S/p cholecystectomy in past, only surgery.  Decreased Mobility  Afib on coumadin INR 1.09  CAD refused CABG  Hx syncope  Hx chronic pain  Hx Hypertension  Hypokalemia resolved   She looks great, she did walk once around the hall here with assist.  I was going to leave her on clears till she has a little more coming thru, but she may be able to go to full liquids later today.  Would continue to mobilize, and IS.      LOS: 6 days    Hayley Long 07/25/2011

## 2011-07-26 DIAGNOSIS — N179 Acute kidney failure, unspecified: Secondary | ICD-10-CM

## 2011-07-26 DIAGNOSIS — E782 Mixed hyperlipidemia: Secondary | ICD-10-CM

## 2011-07-26 DIAGNOSIS — K565 Intestinal adhesions [bands], unspecified as to partial versus complete obstruction: Secondary | ICD-10-CM

## 2011-07-26 DIAGNOSIS — I4891 Unspecified atrial fibrillation: Secondary | ICD-10-CM

## 2011-07-26 LAB — BASIC METABOLIC PANEL
Chloride: 101 mEq/L (ref 96–112)
Creatinine, Ser: 0.53 mg/dL (ref 0.50–1.10)
GFR calc Af Amer: >90 mL/min (ref >90)
GFR calc non Af Amer: 82 mL/min — ABNORMAL LOW (ref >90)
Potassium: 3.4 mEq/L — ABNORMAL LOW (ref 3.5–5.1)

## 2011-07-26 LAB — GLUCOSE, CAPILLARY
Glucose-Capillary: 116 mg/dL — ABNORMAL HIGH (ref 70–99)
Glucose-Capillary: 136 mg/dL — ABNORMAL HIGH (ref 70–99)

## 2011-07-26 MED ORDER — ZINC TRACE METAL 1 MG/ML IV SOLN
INTRAVENOUS | Status: DC
  Filled 2011-07-26: qty 2000

## 2011-07-26 MED ORDER — FAT EMULSION 20 % IV EMUL
240.0000 mL | INTRAVENOUS | Status: DC
  Filled 2011-07-26: qty 250

## 2011-07-26 MED ORDER — POTASSIUM CHLORIDE 10 MEQ/100ML IV SOLN
10.0000 meq | INTRAVENOUS | Status: AC
  Administered 2011-07-26 (×2): 10 meq via INTRAVENOUS
  Filled 2011-07-26 (×2): qty 100

## 2011-07-26 MED ORDER — ZINC TRACE METAL 1 MG/ML IV SOLN
INTRAVENOUS | Status: DC
  Filled 2011-07-26: qty 1000

## 2011-07-26 NOTE — Progress Notes (Signed)
CIR was continuing to follow along with this patient to watch for progress with therapies. Patient is functionally at a higher  level for CIR. Discussed with family who agree that patient is doing very well. Son, Aneta Mins states, he can provide 24hr care as he works from home and can assist as needed. He prefers Home with HH. Will update RNCM that CIR is now signing off and patient's family preference. Marina Goodell adm coordinator [phone].

## 2011-07-26 NOTE — Progress Notes (Signed)
3 Days Post-Op  Subjective: Feeling better, wounds look great, tolerating fulls, and having good BM  Objective: Vital signs in last 24 hours: Temp:  [97 F (36.1 C)-99.4 F (37.4 C)] 98 F (36.7 C) (06/26 0406) Pulse Rate:  [95-160] 95  (06/26 0406) Resp:  [15-18] 18  (06/26 0406) BP: (124-151)/(66-86) 151/83 mmHg (06/26 0406) SpO2:  [94 %-98 %] 94 % (06/26 0406) Weight:  [60 kg (132 lb 4.4 oz)] 60 kg (132 lb 4.4 oz) (06/26 0502) Last BM Date: 07/25/11  2 stools, 600 po recorded, day 6 antibiotics, afebrile, TM 99.4, K+ 3.4,   Intake/Output from previous day: 06/25 0701 - 06/26 0700 In: 1222.5 [P.O.:600; I.V.:50; IV Piggyback:12.5; TPN:560] Out: 2102 [Urine:2100; Stool:2] Intake/Output this shift: Total I/O In: 0  Out: 300 [Urine:300]  General appearance: alert, cooperative and no distress Resp: clear to auscultation bilaterally and fewe rales in bases. GI: soft, non-tender; bowel sounds normal; no masses,  no organomegaly and incision looks good, all laprascopic, with glue.  Lab Results:   [facility] 07/25/11 0450 07/24/11 0428  WBC 7.8 6.9  HGB 11.2* 10.9*  HCT 32.7* 32.5*  PLT 222 205    BMET  Basename 07/26/11 0520 07/25/11 0450  NA 137 136  K 3.4* 3.8  CL 101 103  CO2 27 28  GLUCOSE 146* 123*  BUN 15 19  CREATININE 0.53 0.65  CALCIUM 9.4 8.9   PT/INR No results found for this basename: LABPROT:2,INR:2 in the last 72 hours   Lab 07/25/11 0450 07/24/11 0428 07/23/11 0418 07/22/11 0434 07/21/11 0410  AST 24 41* 57* 20 21  ALT 26 34 37* 9 9  ALKPHOS 30* 26* 29* 28* 28*  BILITOT 0.3 0.4 0.6 0.5 0.6  PROT 5.4* 5.2* 6.1 6.3 6.3  ALBUMIN 2.7* 2.6* 3.1* 3.2* 3.3*     Lipase     Component Value Date/Time   LIPASE 56 07/19/2011 1600     Studies/Results: No results found.  Medications:    . amLODipine  5 mg Oral Daily  . antiseptic oral rinse  15 mL Mouth Rinse q12n4p  . aspirin EC  81 mg Oral Daily  . chlorhexidine  15 mL Mouth Rinse BID  .  enoxaparin  30 mg Subcutaneous Daily  . insulin aspart  0-9 Units Subcutaneous TID WC  . multivitamin with minerals  1 tablet Oral Daily  . pantoprazole (PROTONIX) IV  40 mg Intravenous Q24H  . potassium chloride  10 mEq Intravenous Q1 Hr x 2  . sodium chloride  3 mL Intravenous Q12H    Assessment/Plan SBO secondary to adhesive band in pelvis. S/p LAPAROSCOPY Enterolysis of adhesions 07/23/11 Dr. Ezzard Standing  SBO vs ileus  Probable UTI  Chronic constipation, takes laxatives frequently at home. MG Citrate works best.  S/p cholecystectomy in past, only surgery.  Decreased Mobility  Afib on coumadin INR 1.09  CAD refused CABG  Hx syncope  Hx chronic pain  Hx Hypertension  Hypokalemia resolved   Plan:  Advance diet, begin to wean TNA, mobilization is a big issue.  They want to go home, with son as care taker.  She needs to be walking to do that.  LOS: 7 days    Hayley Long 07/26/2011
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
## 2011-07-26 NOTE — Progress Notes (Addendum)
PARENTERAL NUTRITION CONSULT NOTE - FOLLOW UP  Pharmacy Consult for TNA Indication: Prolonged ileus vs SBO  Allergies  Allergen Reactions  . Corticosteroids Swelling   Patient Measurements: Height: 5\' 5"  (165.1 cm) Weight: 132 lb 4.4 oz (60 kg) IBW/kg (Calculated) : 57   Vital Signs: Temp: 98 F (36.7 C) (06/26 0406) Temp src: Oral (06/26 0406) BP: 151/83 mmHg (06/26 0406) Pulse Rate: 95  (06/26 0406) Intake/Output from previous day: 06/25 0701 - 06/26 0700 In: 1222.5 [P.O.:600; I.V.:50; IV Piggyback:12.5; TPN:560] Out: 2102 [Urine:2100; Stool:2] Intake/Output from this shift: Total I/O In: -  Out: 100 [Urine:100]  Labs:  [facility] 07/25/11 0450 07/24/11 0428  WBC 7.8 6.9  HGB 11.2* 10.9*  HCT 32.7* 32.5*  PLT 222 205  APTT -- --  INR -- --    Basename 07/26/11 0520 07/25/11 0450 07/24/11 0428 07/24/11 0427  NA 137 136 134* --  K 3.4* 3.8 3.9 --  CL 101 103 101 --  CO2 27 28 29  --  GLUCOSE 146* 123* 114* --  BUN 15 19 22  --  CREATININE 0.53 0.65 0.67 --  LABCREA -- -- -- --  CREAT24HRUR -- -- -- --  CALCIUM 9.4 8.9 8.6 --  MG -- 2.1 2.1 --  PHOS -- 3.3 3.2 --  PROT -- 5.4* 5.2* --  ALBUMIN -- 2.7* 2.6* --  AST -- 24 41* --  ALT -- 26 34 --  ALKPHOS -- 30* 26* --  BILITOT -- 0.3 0.4 --  BILIDIR -- -- -- --  IBILI -- -- -- --  PREALBUMIN -- -- 20.0 --  TRIG -- -- -- 58  CHOLHDL -- -- -- --  CHOL -- -- -- 90   Estimated Creatinine Clearance: 43.7 ml/min (by C-G formula based on Cr of 0.53).    Basename 07/25/11 2049 07/25/11 1720 07/25/11 1205  GLUCAP 120* 116* 137*   Medications:  Scheduled:     . amLODipine  5 mg Oral Daily  . antiseptic oral rinse  15 mL Mouth Rinse q12n4p  . aspirin EC  81 mg Oral Daily  . chlorhexidine  15 mL Mouth Rinse BID  . enoxaparin  30 mg Subcutaneous Daily  . insulin aspart  0-9 Units Subcutaneous TID WC  . multivitamin with minerals  1 tablet Oral Daily  . pantoprazole (PROTONIX) IV  40 mg Intravenous Q24H    . sodium chloride  3 mL Intravenous Q12H  . DISCONTD: piperacillin-tazobactam (ZOSYN)  IV  3.375 g Intravenous Q8H   Infusions:     . TPN (CLINIMIX) +/- additives 60 mL/hr at 07/25/11 0900   And  . fat emulsion 250 mL (07/25/11 0800)  . TPN (CLINIMIX) +/- additives 60 mL/hr at 07/25/11 1745  . DISCONTD: 0.9 % NaCl with KCl 20 mEq / L 50 mL (07/23/11 1758)   Nutritional Goals: (per 6/20 RD note) Kcal: 1220-1440. Protein: 66-78 grams. Fluid: 1 ml per kcal intake. Clinimix 5/15 at 17ml/hr 20% fat emulsion at 47ml/hr MWF only provides 72g protein, 1502 kcal on lipid days, 1022 kcal on non-lipid days, for an average of 1228 kcal/day.  Current nutrition: Clinimix E 5/15 at 97ml/hr + 20% fat emulsion at 49ml/hr on MWF only. FLD starting 6/25  CBGs & Insulin requirements past 24 hours:  116-137, 2 units SSI.   IVF: none (flushes only), IVF d/c'd 6/25  Labs:   Electrolytes - K+ 3.4, other lytes WNL  LFTs - wnl.  TGs - wnl.  Prealbumin - 20.  Assessment:  78 yo F admitted 07/19/11 with SBO vs ileus. TNA started 6/21pm for prolonged ileus.  Slow progress with transition to enteral diet d/t nausea.  Tolerating TNA at goal.  Glucose controlled.  Protonix IV.  Plan:  Continue goal rate Clinimix E 5/15 of 41ml/hr.  20% lipid emulsion at 79ml/hr on MWF only due to ongoing national backorder.   Trace elements on MWF only due to ongoing national backorder.  Patient receiving MVI PO daily.  KCl 10 mEq/100 mL IV x 2 runs.  Continue SSI (sens scale), ACHS.  F/u plan for when to wean off TNA.  Clance Boll, PharmD, BCPS Pager: [phone] 07/26/2011 7:48 AM   ADDENDUM: 07/26/2011 11:25 AM Surgery ordered to begin wean of TNA if ok with primary team.  Dr. Mahala Menghini taking over patient today and agrees with weaning off TNA since patient is tolerating FLD and having BMs. Plan: Reduce TNA to 40 ml/hr and stop TNA at 1800 tonight.  Clance Boll, PharmD, BCPS Pager:  [phone] 07/26/2011 11:30 AM

## 2011-07-26 NOTE — Clinical Social Work Note (Signed)
CSW following for SNF. Placement note will not populate into EPIC from [facility]. CSW provided family with list of SNFs and did [facility] and received [facility] today. Per CIR , Pt doing too well for CIR. Pt most likely would not get approval through [facility] for SNF as well. CSW signing off as plan is now for home health. Please reconsult as needed.  Vennie Homans, [place] 07/26/2011 9:12 AM [phone]

## 2011-07-27 DIAGNOSIS — K565 Intestinal adhesions [bands], unspecified as to partial versus complete obstruction: Secondary | ICD-10-CM

## 2011-07-27 DIAGNOSIS — N179 Acute kidney failure, unspecified: Secondary | ICD-10-CM

## 2011-07-27 DIAGNOSIS — E782 Mixed hyperlipidemia: Secondary | ICD-10-CM

## 2011-07-27 DIAGNOSIS — I4891 Unspecified atrial fibrillation: Secondary | ICD-10-CM

## 2011-07-27 LAB — BASIC METABOLIC PANEL
BUN: 21 mg/dL (ref 6–23)
CO2: 25 mEq/L (ref 19–32)
Chloride: 102 mEq/L (ref 96–112)
GFR calc non Af Amer: 77 mL/min — ABNORMAL LOW (ref >90)
Glucose, Bld: 93 mg/dL (ref 70–99)
Potassium: 3.6 mEq/L (ref 3.5–5.1)
Sodium: 136 mEq/L (ref 135–145)

## 2011-07-27 LAB — CBC
HCT: 36.2 % (ref 36.0–46.0)
Hemoglobin: 12.3 g/dL (ref 12.0–15.0)
MCHC: 34 g/dL (ref 30.0–36.0)
RBC: 4.04 MIL/uL (ref 3.87–5.11)

## 2011-07-27 MED ORDER — PROMETHAZINE HCL 12.5 MG PO TABS: 12.5000 mg | ORAL_TABLET | Freq: Four times a day (QID) | ORAL | Status: DC | PRN

## 2011-07-27 MED ORDER — OXYCODONE-ACETAMINOPHEN 5-325 MG PO TABS: 1.0000 | ORAL_TABLET | ORAL | Status: AC | PRN

## 2011-07-27 NOTE — Discharge Summary (Signed)
[facility] Hospital Discharge Summary  Date of Admission: 07/19/2011  1:24 PM Admitter: @ADMITPROV @   Date of Discharge6/27/2013 Attending Physician: Rhetta Mura, MD  Things to Follow-up on: Needs surgery f/u Monitor [email or web address] in 1 week   Hayley Long is a 78 y.o. female admitted on 07/19/11 with n/v/intractable constipation/abdominal pain, ongoing for 2 days prior to admission. She was found to have SBO and UTI. She failed conservative measures of bowel rest/bowel decompression/ivf/abx, and had laparascopic exploratory laparotomy by Dr Ezzard Standing on 07/23/11, with release of an adhesive band in the pelvic area. She has progressively improved, and is tolerating clears, with surgical direction.  She has completed course of abx for UTI. She has been referred to CIR. Appreciate Physical and Rehabilitation Medicine Input.  She is not anticoagulated due to fall risk, and dementia. Dr Leodis Sias manages her cardiac issues, and per family, the home meds for this have been the optimal combination. I have started to reintroduce them gradually, with norvasc 6/25, hctz added back cautiously given basleine increase in creat.   Followed chronically by cardiology Dr. Elease Hashimoto since 1998  CHF  Hypertension  History of noncompliance with medical therapy  Acute cholecystitis status post laparoscopic cholecystectomy 11/16/2003, Dr. Zachery Dakins  Status post cardiac catheterization 1998  Cardiac cath 08/11/2005 = LAD = 80% stenosis at origin, chronic total occlusion LAD with good collateral refill right coronary, ischemia notable for Cardiolite scanning-was high risk for angioplasty and PCI was not performed  Repeat cardiac cath 04/12/2007 = occluded LAD with collateral filling from right coronary artery-stable from prior Cath  History of chronic bradycardia, syncopal noted to 07/26/2007  History chronic pain syndrome   Hospital Course by problem list: * SBO (small bowel obstruction) due to adhesive  band s/p release- appreciate surgery. Post op day 4. Tolerated full meals. TPN management per Surgeon, suspect would not need this and was d/c 6/26.She was ambulated and PT/Ot mentioned she will need some ancillary devices for use at home but could be d/c back into the care of he rson who works at home with PT/OT follow up as outpatient- cleared from surgeon standpoint  * Uti-completed course of abx during this hospitalization  * Atrial fibrillation- In and out of sinus. Monitor none Coumadin candidate * Hypertension- resume amlodipine 5mg  6/25. As blood pressure slightly u0p, add HCTZ 12.5, and eventually to 25 mg on discharge in am, can resume losartan 80-160 as outpatient she had some baseline renal insufficiency on admission * Hypokalemia/hypomagnesemia- resolved- continue replacemnt orally if drops below 3.2  * dvt prophylaxis.  Discussed plan of care with daughter at bed side.  Dispo-likely d/c home 1-2 days once cleared by PT/Ot and surgeon   LOS: 8 days     Procedures Performed and pertinent labs: Dg Abd 1 View  07/21/2011  *RADIOLOGY REPORT*  Clinical Data: Ileus, abdominal pain  ABDOMEN - 1 VIEW  Comparison: 07/20/2011  Findings: Prominent air filled loops of small bowel again seen throughout the abdomen with paucity of colonic gas. Findings remain compatible with small bowel obstruction. No definite bowel wall thickening. Surgical clips right upper quadrant question cholecystectomy. Degenerative changes lower lumbar spine.  IMPRESSION: Persistent small bowel dilatation consistent with small bowel obstruction.  Original Report Authenticated By: Lollie Marrow, M.D.   Ct Abdomen Pelvis W Contrast  07/19/2011  *RADIOLOGY REPORT*  Clinical Data: Abdominal pain and nausea.  Chronic constipation.  CT ABDOMEN AND PELVIS WITH CONTRAST  Technique:  Multidetector CT imaging of the abdomen and pelvis  was performed following the standard protocol during bolus administration of intravenous contrast.   Contrast: OMNIPAQUE IOHEXOL 300 MG/ML  SOLN  Comparison: None.  Findings: Distended stomach and moderately dilated small bowel loops are seen, with transition point in the central pelvis. Nondilated distal small bowel is seen distal to this transition point.  No soft tissue mass or inflammatory process identified. This is consistent with a high-grade small bowel obstruction, likely due to adhesion.  Bilateral renal cysts are noted but there is no evidence of renal mass or hydronephrosis.  The pancreas, spleen, adrenal glands, and liver are normal in appearance.  Prior cholecystectomy noted.  The uterus and adnexa are unremarkable.  Moderate to large colonic stool burden noted, without significant colonic dilatation.  No focal inflammatory process or abnormal fluid collections are identified.  Foley catheter is seen within the bladder, which is nearly empty.  IMPRESSION:  1.  High-grade distal small bowel obstruction, with transition point in the central pelvis, most likely due to an adhesion. 2.  No mass or inflammatory process identified.  Original Report Authenticated By: Danae Orleans, M.D.   Dg Chest Port 1 View  07/21/2011  *RADIOLOGY REPORT*  Clinical Data: PICC line placement.  PORTABLE CHEST - 1 VIEW  Comparison: 03/03/2008 chest radiograph  Findings: A right PICC line is identified with tip overlying the cavoatrial junction. An NG tube is identified coiled in the proximal stomach. Mild peribronchial thickening is again identified. There is no evidence of focal airspace disease, pulmonary edema, suspicious pulmonary nodule/mass, pleural effusion, or pneumothorax. No acute bony abnormalities are identified.  IMPRESSION: Right PICC line with tip at the cavoatrial junction.  NG tube coiled in the proximal stomach.  Original Report Authenticated By: Rosendo Gros, M.D.   Dg Abd 2 Views  07/23/2011  *RADIOLOGY REPORT*  Clinical Data: Small bowel obstruction - follow-up.  ABDOMEN - 2 VIEW  Comparison:  07/22/2011 and prior radiographs.  07/19/2011 CT  Findings: Dilated small bowel loops are again noted, compatible with small bowel obstruction. Some gas and stool within the colon is again identified. An NG tube is present with tip overlying the proximal - mid stomach. There is no evidence of pneumoperitoneum. Cholecystectomy clips are identified.  IMPRESSION: Relatively unchanged dilated small bowel loops compatible with small bowel obstruction.  No evidence of pneumoperitoneum.  Original Report Authenticated By: Rosendo Gros, M.D.   Dg Abd 2 Views  07/22/2011  *RADIOLOGY REPORT*  Clinical Data: Small bowel obstruction follow-up  ABDOMEN - 2 VIEW  Comparison: 07/21/2011  Findings: NG tube is in the stomach.  Small bowel dilatation has improved.  There are small bowel air fluid levels present.  There is gas in the colon which is nondilated.  Negative for pneumoperitoneum.  Degenerative changes at L4-5.  Gallbladder clips are present  IMPRESSION: Small bowel dilatation with air-fluid levels has improved in the interval.  Pattern suggests small bowel obstruction.  Original Report Authenticated By: Camelia Phenes, M.D.   Dg Abd 2 Views  07/20/2011  *RADIOLOGY REPORT*  Clinical Data: Small bowel obstruction, abdominal pain.  ABDOMEN - 2 VIEW  Comparison: 07/19/2011  Findings: NG tube has been placed into the stomach.  Continued small bowel dilatation.  Stool and gas within nondistended colon. Prior cholecystectomy.  No organomegaly.  No free air.  IMPRESSION: Continued small bowel obstruction pattern, not significantly changed.  Original Report Authenticated By: Cyndie Chime, M.D.   Dg Abd Acute W/chest  07/19/2011  *RADIOLOGY REPORT*  Clinical  Data: Nausea and vomiting for 1 day.  ACUTE ABDOMEN SERIES (ABDOMEN 2 VIEW & CHEST 1 VIEW)  Comparison: 11/16/2003.  Findings: There is moderate enlargement of the cardiac silhouette. Ectasia and nonaneurysmal calcification of the thoracic aorta are seen.  No  pulmonary infiltrates or nodules were evident.  Hilar and mediastinal contours appear stable. No pleural abnormality is evident.  There is slight thoracic scoliosis convexity to the right.  There is degenerative spondylosis.  No pneumoperitoneum is evident on the left side down decubitus abdominal examination.  There is dilatation of multiple loops of small intestine with some air fluid levels on decubitus examination.  There is moderate gaseous and fluid distention of the transverse colon.  Rectal gas is identified.  No opaque calculi are seen.  Pelvic phleboliths are present.  There is osteophyte formation in the spine.  There is a very dense appearance of the L5 vertebral body.  This could reflect degenerative sclerosis but I cannot exclude blastic lesion.  Does the patient have low back pain?  IMPRESSION: Moderate cardiac silhouette enlargement.  No pulmonary edema, pneumonia, or pleural effusion.  Scoliosis.  Degenerative spondylosis.  No evidence of pneumoperitoneum.  There is dilatation of multiple loops of small intestine with some air fluid levels on decubitus examination.  There is moderate gaseous and fluid distention of the transverse colon.  Rectal gas is identified. There may be an element of ileus.  The small bowel dilatation is out of proportion to the colonic dilatation and I am concerned that there may be an element of partial small-bowel obstruction.  There is a very dense appearance of the L5 vertebral body.  This could reflect degenerative sclerosis but I cannot exclude blastic lesion.  Does the patient have low back pain?  CT of abdomen and pelvis is scheduled to be performed.  Original Report Authenticated By: Crawford Givens, M.D.    Discharge Vitals & PE:  BP 106/65  Pulse 76  Temp 98.4 F (36.9 C) (Oral)  Resp 16  Ht 5\' 5"  (1.651 m)  Wt 59.8 kg (131 lb 13.4 oz)  BMI 21.94 kg/m2  SpO2 97% GENERAL- alert, sitting up in chair, daughter at bed side.  HEAD- normal atraumatic, no neck  masses, normal thyroid, no jvd  RESPIRATORY- appears well, vitals normal, no respiratory distress, acyanotic, normal RR, ear and throat exam is normal, neck free of mass or lymphadenopathy, chest clear, no wheezing, crepitations, rhonchi, normal symmetric air entry  CVS- regular rate and rhythm, S1, S2 normal, no murmur, click, rub or gallop  ABDOMEN- abdomen is soft without significant tenderness, masses, organomegaly or guarding  NEURO- Grossly normal  EXTREMITIES- extremities normal, atraumatic, no cyanosis or edema  Discharge Labs:  Results for orders placed during the hospital encounter of 07/19/11 (from the past 24 hour(s))  CBC     Status: Abnormal   Collection Time   07/27/11  5:00 AM      Component Value Range   WBC 12.6 (*) 4.0 - 10.5 K/uL   RBC 4.04  3.87 - 5.11 MIL/uL   Hemoglobin 12.3  12.0 - 15.0 g/dL   HCT 16.1  09.6 - 04.5 %   MCV 89.6  78.0 - 100.0 fL   MCH 30.4  26.0 - 34.0 pg   MCHC 34.0  30.0 - 36.0 g/dL   RDW 40.9  81.1 - 91.4 %   Platelets 291  150 - 400 K/uL  BASIC METABOLIC PANEL     Status: Abnormal   Collection Time  07/27/11  5:00 AM      Component Value Range   Sodium 136  135 - 145 mEq/L   Potassium 3.6  3.5 - 5.1 mEq/L   Chloride 102  96 - 112 mEq/L   CO2 25  19 - 32 mEq/L   Glucose, Bld 93  70 - 99 mg/dL   BUN 21  6 - 23 mg/dL   Creatinine, Ser 1.61  0.50 - 1.10 mg/dL   Calcium 9.4  8.4 - 09.6 mg/dL   GFR calc non Af Amer 77 (*) >90 mL/min   GFR calc Af Amer 90 (*) >90 mL/min    Disposition and follow-up:   Hayley Long was discharged from in good condition.    Follow-up Appointments:  Follow-up Information    Follow up with NEWMAN,DAVID H, MD. Schedule an appointment as soon as possible for a visit in 3 weeks.   Contact information:   [employer] [address] [place] [address] [phone]          Discharge Medications: Medication List  As of 07/27/2011  2:54 PM   STOP  taking these medications         valsartan 160 MG tablet         TAKE these medications         amLODipine 5 MG tablet   Commonly known as: NORVASC   Take 5 mg by mouth daily.      aspirin 81 MG tablet   Take 81 mg by mouth daily.      calcium carbonate 200 MG capsule   Take 250 mg by mouth 2 (two) times daily with a meal.      hydrochlorothiazide 25 MG tablet   Commonly known as: HYDRODIURIL   Take 25 mg by mouth daily.      multivitamin tablet   Take 1 tablet by mouth daily.      nitroGLYCERIN 0.4 MG SL tablet   Commonly known as: NITROSTAT   Place 0.4 mg under the tongue every 5 (five) minutes as needed. Chest pain      OSTEO BI-FLEX JOINT SHIELD PO   Take by mouth 2 (two) times daily.      oxyCODONE-acetaminophen 5-325 MG per tablet   Commonly known as: PERCOCET   Take 1 tablet by mouth every 4 (four) hours as needed for pain.      potassium chloride 10 MEQ tablet   Commonly known as: K-DUR,KLOR-CON   Take 10 mEq by mouth daily.      promethazine 12.5 MG tablet   Commonly known as: PHENERGAN   Take 1 tablet (12.5 mg total) by mouth every 6 (six) hours as needed for nausea.           Medications Discontinued During This Encounter  Medication Reason  . CALCIUM PO Completed Course  . DIOVAN 160 MG tablet Completed Course  . NORVASC 5 MG tablet Completed Course  . potassium chloride (K-DUR,KLOR-CON) 10 MEQ tablet   . nitroGLYCERIN (NITROSTAT) 0.4 MG SL tablet   . hydrochlorothiazide (HYDRODIURIL) 25 MG tablet   . 0.9 %  sodium chloride infusion   . aspirin tablet 81 mg   . vancomycin (VANCOCIN) 500 mg in sodium chloride 0.9 % 100 mL IVPB   . metoprolol (LOPRESSOR) injection 2.5 mg   . metoprolol (LOPRESSOR) injection 2.5 mg   . vancomycin (VANCOCIN) 500 mg in sodium chloride 0.9 % 100 mL IVPB Discontinued by provider  .  TPN (CLINIMIX) +/- additives   . dextrose 5 % and 0.9 % NaCl with KCl 20 mEq/L infusion   . 0.9 %  sodium chloride infusion   .  dextrose 5 % and 0.9 % NaCl with KCl 20 mEq/L infusion Change in therapy  . bupivacaine (MARCAINE) 0.25 % injection Patient Discharge  . lactated ringers irrigation solution Patient Discharge  . sodium chloride irrigation 0.9 % Patient Discharge  . aspirin chewable tablet 81 mg   . morphine 2 MG/ML injection 0.5 mg   . insulin aspart (novoLOG) injection 0-9 Units   . insulin aspart (novoLOG) injection 0-9 Units Change in therapy  . sodium phosphate (FLEET) 7-19 GM/118ML enema 1 enema   . HYDROmorphone (DILAUDID) injection 0.25-0.5 mg   . piperacillin-tazobactam (ZOSYN) IVPB 3.375 g   . 0.9 % NaCl with KCl 20 mEq/ L  infusion   . TPN (CLINIMIX) +/- additives   . TPN (CLINIMIX) +/- additives   . chlorhexidine (PERIDEX) 0.12 % solution 15 mL   . antiseptic oral rinse (BIOTENE) solution 15 mL   . fat emulsion 20 % infusion 240 mL   . insulin aspart (novoLOG) injection 0-9 Units   . valsartan (DIOVAN) 160 MG tablet Stop Taking at Discharge      > 40 minutes time spent preparing d/c summary, including direct face-face patient Time, contact with consultants, family and care coordination   Signed: Rhetta Mura 07/27/2011, 2:54 PM

## 2011-07-27 NOTE — Progress Notes (Signed)
4 Days Post-Op  Subjective: PT and family want to go home as soon a possible, she's still needing allot of help getting her up.  PT is only 3 times per week, so she only walked to BR yesterday. Low fiber diet, doing well. She has a PICC line in  Objective: Vital signs in last 24 hours: Temp:  [98 F (36.7 C)-98.3 F (36.8 C)] 98.1 F (36.7 C) (06/27 0601) Pulse Rate:  [96-102] 99  (06/27 0601) Resp:  [16-20] 18  (06/27 0601) BP: (109-134)/(65-72) 115/72 mmHg (06/27 0601) SpO2:  [94 %-96 %] 94 % (06/27 0601) Weight:  [59.8 kg (131 lb 13.4 oz)] 59.8 kg (131 lb 13.4 oz) (06/27 0601) Last BM Date: 07/27/11  600 PO, 4 stools recorded, afebrile,  Vss, WBC is up, off antibiotics 3 days.  Intake/Output from previous day: 06/26 0701 - 06/27 0700 In: 600 [P.O.:600] Out: 804 [Urine:800; Stool:4] Intake/Output this shift: Total I/O In: 120 [P.O.:120] Out: -   General appearance: alert, cooperative and no distress Resp: clear to auscultation bilaterally GI: soft, non-tender; still sore bowel sounds normal; no masses,  no organomegaly wounds look good,  Lab Results:   Basename 07/27/11 0500 07/25/11 0450  WBC 12.6* 7.8  HGB 12.3 11.2*  HCT 36.2 32.7*  PLT 291 222    BMET  Basename 07/27/11 0500 07/26/11 0520  NA 136 137  K 3.6 3.4*  CL 102 101  CO2 25 27  GLUCOSE 93 146*  BUN 21 15  CREATININE 0.64 0.53  CALCIUM 9.4 9.4   PT/INR No results found for this basename: LABPROT:2,INR:2 in the last 72 hours   Lab 07/25/11 0450 07/24/11 0428 07/23/11 0418 07/22/11 0434 07/21/11 0410  AST 24 41* 57* 20 21  ALT 26 34 37* 9 9  ALKPHOS 30* 26* 29* 28* 28*  BILITOT 0.3 0.4 0.6 0.5 0.6  PROT 5.4* 5.2* 6.1 6.3 6.3  ALBUMIN 2.7* 2.6* 3.1* 3.2* 3.3*     Lipase     Component Value Date/Time   LIPASE 56 07/19/2011 1600     Studies/Results: No results found.  Medications:    . amLODipine  5 mg Oral Daily  . aspirin EC  81 mg Oral Daily  . enoxaparin  30 mg Subcutaneous  Daily  . multivitamin with minerals  1 tablet Oral Daily  . pantoprazole (PROTONIX) IV  40 mg Intravenous Q24H  . potassium chloride  10 mEq Intravenous Q1 Hr x 2  . sodium chloride  3 mL Intravenous Q12H  . DISCONTD: antiseptic oral rinse  15 mL Mouth Rinse q12n4p  . DISCONTD: chlorhexidine  15 mL Mouth Rinse BID  . DISCONTD: insulin aspart  0-9 Units Subcutaneous TID WC    Assessment/Plan SBO secondary to adhesive band in pelvis. S/p LAPAROSCOPY Enterolysis of adhesions 07/23/11 Hayley Long  SBO vs ileus  Probable UTI  Chronic constipation, takes laxatives frequently at home. MG Citrate works best.  S/p cholecystectomy in past, only surgery.  Decreased Mobility  Afib on coumadin INR 1.09  CAD refused CABG  Hx syncope  Hx chronic pain  Hx Hypertension  Hypokalemia resolved  Plan:  Discuss with Hayley Long, get PICC line out and recheck labs tomorrow, home perhaps Friday.    LOS: 8 days    Hayley Long 07/27/2011

## 2011-07-27 NOTE — Progress Notes (Signed)
Will try for discharge later today if family, pt, medical team, and physical therapy feel she is ready

## 2011-07-27 NOTE — Evaluation (Signed)
Occupational Therapy Evaluation Patient Details Name: Hayley Long MRN: [id number] DOB: Sep 17, 1920 Today's Date: 07/27/2011 Time: 0940-1006 OT Time Calculation (min): 26 min  OT Assessment / Plan / Recommendation Clinical Impression  Pt is an 78yo female who presents with a SBO repair. Pt and family are refusing snf but son is home with pt all day. Skilled OT indicated to maximize I w/BADLs to supervision level in prep for safe d/c home with 24/7 A and HHOT. Son declines BSC.    OT Assessment  Patient needs continued OT Services    Follow Up Recommendations  Home health OT;Supervision/Assistance - 24 hour    Barriers to Discharge      Equipment Recommendations  Toilet rise with handles;Tub/shower seat    Recommendations for Other Services    Frequency  Min 2X/week    Precautions / Restrictions Precautions Precautions: Fall   Pertinent Vitals/Pain     ADL  Grooming: Simulated;Set up Where Assessed - Grooming: Unsupported sitting Upper Body Bathing: Simulated;Set up Where Assessed - Upper Body Bathing: Unsupported sitting Lower Body Bathing: Simulated;Moderate assistance Where Assessed - Lower Body Bathing: Supported sit to stand Upper Body Dressing: Simulated;Set up Where Assessed - Upper Body Dressing: Unsupported sitting Lower Body Dressing: Performed;Minimal assistance Where Assessed - Lower Body Dressing: Unsupported sitting (don/doff socks) Toilet Transfer: Performed;Minimal assistance Toilet Transfer Method: Sit to stand Toilet Transfer Equipment: Regular height toilet;Grab bars Toileting - Clothing Manipulation and Hygiene: Performed;Minimal assistance (for thoroghness with hygeine) Where Assessed - Toileting Clothing Manipulation and Hygiene: Sit to stand from 3-in-1 or toilet Equipment Used: Rolling walker;Gait belt Transfers/Ambulation Related to ADLs: Pt ambulated to bathroom with MAX cues to stay within confines of RW. Pt extremely unsafe when ambulating,  frequently veering RW in one direction while she attemps to walk in another and running into obstacles.    OT Diagnosis: Generalized weakness  OT Problem List: Decreased activity tolerance;Decreased cognition;Decreased safety awareness;Decreased knowledge of use of DME or AE;Impaired vision/perception OT Treatment Interventions: Self-care/ADL training;Therapeutic activities;DME and/or AE instruction;Patient/family education   OT Goals Acute Rehab OT Goals OT Goal Formulation: With patient/family Time For Goal Achievement: 08/10/11 Potential to Achieve Goals: Good ADL Goals Pt Will Perform Grooming: with supervision;Standing at sink ADL Goal: Grooming - Progress: Goal set today Pt Will Perform Lower Body Bathing: with supervision;Sit to stand from chair;Sit to stand from bed ADL Goal: Lower Body Bathing - Progress: Goal set today Pt Will Perform Lower Body Dressing: with supervision;Sit to stand from chair;Sit to stand from bed ADL Goal: Lower Body Dressing - Progress: Goal set today Pt Will Transfer to Toilet: with supervision;Comfort height toilet;Regular height toilet;Ambulation ADL Goal: Toilet Transfer - Progress: Goal set today Pt Will Perform Toileting - Clothing Manipulation: with supervision;Standing ADL Goal: Toileting - Clothing Manipulation - Progress: Goal set today Pt Will Perform Toileting - Hygiene: with supervision;Sit to stand from 3-in-1/toilet ADL Goal: Toileting - Hygiene - Progress: Goal set today Pt Will Perform Tub/Shower Transfer: Shower transfer;with min assist;Ambulation ADL Goal: Tub/Shower Transfer - Progress: Goal set today  Visit Information  Last OT Received On: 07/27/11 Assistance Needed: +1    Subjective Data  Subjective: Im not sure what you want me to do. Patient Stated Goal: Per daughter, for pt to return home with son.   Prior Functioning  Home Living Lives With: Son Available Help at Discharge: Family;Available 24 hours/day Type of Home:  House Home Access: Stairs to enter [employer] of Steps: 1 Entrance Stairs-Rails: None Home Layout: One level  Bathroom Shower/Tub: Health visitor: Standard Home Adaptive Equipment: Straight cane;Walker - rolling Prior Function Level of Independence: Independent with assistive device(s) Able to Take Stairs?: No Driving: No Vocation: Retired Musician: Clinical cytogeneticist  Overall Cognitive Status: Impaired Area of Impairment: Attention;Memory;Safety/judgement;Awareness of deficits;Awareness of errors Arousal/Alertness: Awake/alert Orientation Level: Disoriented to;Time;Situation Behavior During Session: [facility] for tasks performed Current Attention Level: Sustained Memory: Decreased recall of precautions Safety/Judgement: Decreased awareness of safety precautions;Decreased safety judgement for tasks assessed;Impulsive;Decreased awareness of need for assistance Awareness of Errors: Assistance required to identify errors made;Assistance required to correct errors made    Extremity/Trunk Assessment Right Upper Extremity Assessment RUE ROM/Strength/Tone: [facility] for tasks assessed Left Upper Extremity Assessment LUE ROM/Strength/Tone: [facility] for tasks assessed   Mobility Bed Mobility Bed Mobility: Supine to Sit Supine to Sit: 4: Min assist;HOB elevated;With rails Details for Bed Mobility Assistance: Max cues for hand placement and sequencing. Transfers Sit to Stand: 4: Min assist;With armrests;With upper extremity assist;From toilet;From bed Stand to Sit: 4: Min assist;With upper extremity assist;With armrests;To chair/3-in-1;To toilet Details for Transfer Assistance: Max VCs for hand placement and safety.   Exercise    Balance    End of Session OT - End of Session Equipment Utilized During Treatment: Gait belt Activity Tolerance: Patient tolerated treatment well Patient left: in chair;with call bell/phone within reach;with chair alarm set;with  family/visitor present  GO     [name] A OTR/L [phone] 07/27/2011, 10:42 AM

## 2011-07-27 NOTE — Discharge Instructions (Signed)
[facility] ______[facility] °LAPAROSCOPIC SURGERY: POST OP INSTRUCTIONS °Always review your discharge instruction sheet given to you by the facility where your surgery was performed. °IF YOU HAVE DISABILITY OR FAMILY LEAVE FORMS, YOU MUST BRING THEM TO THE OFFICE FOR PROCESSING.   °DO NOT GIVE THEM TO YOUR DOCTOR. ° °1. A prescription for pain medication may be given to you upon discharge.  Take your pain medication as prescribed, if needed.  If narcotic pain medicine is not needed, then you may take acetaminophen (Tylenol) or ibuprofen (Advil) as needed. °2. Take your usually prescribed medications unless otherwise directed. °3. If you need a refill on your pain medication, please contact your pharmacy.  They will contact our office to request authorization. Prescriptions will not be filled after 5pm or on week-ends. °4. You should follow a light diet the first few days after arrival home, such as soup and crackers, etc.  Be sure to include lots of fluids daily. °5. Most patients will experience some swelling and bruising in the area of the incisions.  Ice packs will help.  Swelling and bruising can take several days to resolve.  °6. It is common to experience some constipation if taking pain medication after surgery.  Increasing fluid intake and taking a stool softener (such as Colace) will usually help or prevent this problem from occurring.  A mild laxative (Milk of Magnesia or Miralax) should be taken according to package instructions if there are no bowel movements after 48 hours. °7. Unless discharge instructions indicate otherwise, you may remove your bandages 24-48 hours after surgery, and you may shower at that time.  You may have steri-strips (small skin tapes) in place directly over the incision.  These strips should be left on the skin for 7-10 days.  If your surgeon used skin glue on the incision, you may shower in 24 hours.  The glue will flake off over the next 2-3 weeks.  Any sutures or  staples will be removed at the office during your follow-up visit. °8. ACTIVITIES:  You may resume regular (light) daily activities beginning the next day--such as daily self-care, walking, climbing stairs--gradually increasing activities as tolerated.  You may have sexual intercourse when it is comfortable.  Refrain from any heavy lifting or straining until approved by your doctor. °a. You may drive when you are no longer taking prescription pain medication, you can comfortably wear a seatbelt, and you can safely maneuver your car and apply brakes. °b. RETURN TO WORK:  __________________________________________________________ °9. You should see your doctor in the office for a follow-up appointment approximately 2-3 weeks after your surgery.  Make sure that you call for this appointment within a day or two after you arrive home to insure a convenient appointment time. °10. OTHER INSTRUCTIONS: __________________________________________________________________________________________________________________________ __________________________________________________________________________________________________________________________ °WHEN TO CALL YOUR DOCTOR: °1. Fever over 101.0 °2. Inability to urinate °3. Continued bleeding from incision. °4. Increased pain, redness, or drainage from the incision. °5. Increasing abdominal pain ° °The clinic staff is available to answer your questions during regular business hours.  Please don’t hesitate to call and ask to speak to one of the nurses for clinical concerns.  If you have a medical emergency, go to the nearest emergency room or call 911.  A surgeon from [facility] is always on call at the hospital. °[address], [address], [place], [place]  [address] ? [address] [address], , [place]   [address] °[phone] ? [phone] ? FAX [phone] °Web site:   [email or web address] °

## 2011-07-27 NOTE — Care Management Note (Signed)
    Page 1 of 2   07/27/2011     3:08:49 PM   CARE MANAGEMENT NOTE 07/27/2011  Patient:  Hayley Long, Hayley Long   Account Number:  [id number]  Date Initiated:  07/20/2011  Documentation initiated by:  Jiles Crocker  Subjective/Objective Assessment:   ADMITTED WITH SBO     Action/Plan:   PCP: Mickie Hillier, MD; LIVES WITH FAMILY MEMBERS   Anticipated DC Date:  07/27/2011   Anticipated DC Plan:  HOME W HOME HEALTH SERVICES      DC Planning Services  CM consult      Choice offered to / List presented to:  C-4 Adult Children   DME arranged  BEDSIDE COMMODE  TUB BENCH      DME agency  [facility]     HH arranged  HH-1 RN  HH-2 PT  HH-3 OT  HH-4 NURSE'S AIDE      HH agency  [facility]   Status of service:  Completed, signed off Medicare Important Message given?  NA - LOS <3 / Initial given by admissions (If response is "NO", the following Medicare IM given date fields will be blank) Date Medicare IM given:   Date Additional Medicare IM given:    Discharge Disposition:  HOME W HOME HEALTH SERVICES  Per UR Regulation:  Reviewed for med. necessity/level of care/duration of stay  If discussed at Long Length of Stay Meetings, dates discussed:    Comments:  07/27/11 Desirie Minteer RN,BSN NCM [phone] [facility] CHOSEN BY SON(VIA PHONE),CONTACTED SUSAN DALE(LIASON) ABOUT D/C TODAY, & HH/DME.  07/20/2011- B CHANDLER RN, BSN, MHA

## 2011-07-27 NOTE — Progress Notes (Signed)
Physical Therapy Treatment Patient Details Name: ROKIA BOSKET MRN: [id number] DOB: [birth year] Today's Date: 07/27/2011 Time: 2841-3244 PT Time Calculation (min): 30 min  PT Assessment / Plan / Recommendation Comments on Treatment Session  Pt too high level for CIR and does not want to go to SNF.  Plans are for D/C to home as long as pt has 24/7 assist.  Pt lives with son who works from the home.  Pt is HOH and demon some safety cognition deficit and advised daughter pt should not amb on her own for safety reasons.  Pt will need HH PT & OT as well as equipment:     Follow Up Recommendations  Supervision/Assistance - 24 hour    Barriers to Discharge        Equipment Recommendations  Rolling walker with 5" wheels;Tub/shower seat;Toilet rise with handles    Recommendations for Other Services    Frequency Min 3X/week   Plan Discharge plan remains appropriate    Precautions / Restrictions Precautions Precautions: Fall   Pertinent Vitals/Pain No c/o pain RA 98%      HR 112 during amb    Mobility  Bed Mobility Bed Mobility: Supine to Sit Supine to Sit: 5: Supervision Details for Bed Mobility Assistance: Onr initial VC for direction Transfers Transfers: Sit to Stand;Stand to Sit Sit to Stand: 4: Min guard;From bed Stand to Sit: 4: Min guard Details for Transfer Assistance: 75% VC's for proper tech to push up from bed/chair vs pull up from walker. Also, 75% VC's for safety with turn completion prior to sit. Ambulation/Gait Ambulation/Gait Assistance: 4: Min guard Ambulation Distance (Feet): 500 Feet Assistive device: Rolling walker Ambulation/Gait Assistance Details: No LOB and 25% VC's on proper walker to self distance Gait Pattern: Step-through pattern General Gait Details: RA O2 98%, HR 112 during amb      PT Goals     progressing    Visit Information  Last PT Received On: 07/27/11 Assistance Needed: +1    Subjective Data      Cognition  Overall Cognitive  Status: Impaired Area of Impairment: Attention;Memory;Safety/judgement;Awareness of deficits;Awareness of errors Arousal/Alertness: Awake/alert Orientation Level: Disoriented to;Time;Situation Behavior During Session: [facility] for tasks performed Current Attention Level: Sustained Memory: Decreased recall of precautions Safety/Judgement: Decreased awareness of safety precautions;Decreased safety judgement for tasks assessed;Impulsive;Decreased awareness of need for assistance Awareness of Errors: Assistance required to identify errors made;Assistance required to correct errors made        End of Session PT - End of Session Equipment Utilized During Treatment: Gait belt Activity Tolerance: Patient tolerated treatment well Patient left: in chair;with call bell/phone within reach;with family/visitor present Nurse Communication: Mobility status;Other (comment) (RN observed pt amb in hallway)   Felecia Shelling  PTA [facility]  Acute  Rehab Pager     [phone]

## 2011-08-01 ENCOUNTER — Telehealth (INDEPENDENT_AMBULATORY_CARE_PROVIDER_SITE_OTHER): Payer: Self-pay

## 2011-08-01 NOTE — Telephone Encounter (Signed)
Pt needs a f/u appt with Dr Ezzard Standing for 3wks. Pls  Call pt's son at work [phone].

## 2011-08-09 ENCOUNTER — Telehealth: Payer: Self-pay | Admitting: Cardiovascular Disease

## 2011-08-09 NOTE — Telephone Encounter (Signed)
bmet done in hospital 07/27/11, normal results

## 2011-08-09 NOTE — Telephone Encounter (Signed)
Please return call to patient son Aneta Mins at [phone].  He would like to know if patient should resched lab appnt previously cancled 6/21 while she was in the hospital for BMET.

## 2011-08-10 ENCOUNTER — Ambulatory Visit (INDEPENDENT_AMBULATORY_CARE_PROVIDER_SITE_OTHER): Payer: Medicare Other | Admitting: Surgery

## 2011-08-10 VITALS — BP 118/72 | HR 68 | Temp 97.3°F | Resp 16 | Ht 66.0 in | Wt 127.4 lb

## 2011-08-10 DIAGNOSIS — K56609 Unspecified intestinal obstruction, unspecified as to partial versus complete obstruction: Secondary | ICD-10-CM

## 2011-08-10 NOTE — Progress Notes (Signed)
[facility]  Ovidio Kin, MD,  FACS [address] [place]  [address] [place], [place]    [address] Phone:  [phone] FAX:  [phone]   Re:   DEONNA KRUMMEL DOB:   [birth year] MRN:   [id number]  ASSESSMENT AND PLAN: 1.  S/P laparoscopic enterolysis - 07/23/2011 - D. Marylene Masek  Has done well.    Return PRN  2. Chronic constipation   Takes laxatives frequently at home.  Though she has done some better since going home. 3. Coronary artery disease   Followed by Dr. Elease Hashimoto   Cardiac cath 08/11/2005 = LAD = 80% stenosis at origin, chronic total occlusion LAD with good collateral refill right coronary, ischemia notable for Cardiolite scanning-was high risk for angioplasty and PCI was not performed Repeat cardiac cath 04/12/2007 = occluded LAD with collateral filling from right coronary artery-stable from prior Cath   4. Atrial fibrillation   Has not been on coumadin. 5. Hyperlipidemia  6. Hypertension  7.  Arthritis is bothering her. 8.  Ganglions of right wrist.  HISTORY OF PRESENT ILLNESS: Chief Complaint  Patient presents with  . Follow-up    po sbo    Kimisha Eunice Alvidrez is a 78 y.o. (DOB: 1920/02/13)  white female who is a patient of Mickie Hillier, MD and comes to me today for follow up of lap enterolysis.  She has done well at home.  She has no issues or complaints.  She is accompanied with her son, Aneta Mins.  PHYSICAL EXAM: BP 118/72  Pulse 68  Temp 97.3 F (36.3 C) (Temporal)  Resp 16  Ht 5\' 6"  (1.676 m)  Wt 127 lb 6.4 oz (57.788 kg)  BMI 20.56 kg/m2  Abdomen:  Soft.  Incisions well healed.  DATA REVIEWED: No new data.   Ovidio Kin, MD, FACS Office:  [phone]

## 2011-08-23 ENCOUNTER — Other Ambulatory Visit: Payer: Self-pay | Admitting: *Deleted

## 2011-08-23 MED ORDER — HYDROCHLOROTHIAZIDE 25 MG PO TABS: 25.0000 mg | ORAL_TABLET | Freq: Every day | ORAL | Status: DC

## 2011-08-23 NOTE — Telephone Encounter (Signed)
Refilled hctz 

## 2011-09-03 ENCOUNTER — Other Ambulatory Visit: Payer: Self-pay | Admitting: Cardiovascular Disease

## 2011-09-04 MED ORDER — AMLODIPINE BESYLATE 5 MG PO TABS: 5.0000 mg | ORAL_TABLET | Freq: Every day | ORAL | Status: DC

## 2011-09-04 NOTE — Telephone Encounter (Signed)
Fax Received. Refill Completed. Hayley Long (R.M.A)   

## 2011-09-04 NOTE — Addendum Note (Signed)
Addended by: Andrey Cota A on: 09/04/2011 08:54 AM   Modules accepted: Orders

## 2011-12-21 ENCOUNTER — Other Ambulatory Visit: Payer: Self-pay | Admitting: *Deleted

## 2011-12-21 MED ORDER — POTASSIUM CHLORIDE CRYS ER 10 MEQ PO TBCR: 10.0000 meq | EXTENDED_RELEASE_TABLET | Freq: Every day | ORAL | Status: DC

## 2011-12-21 NOTE — Telephone Encounter (Signed)
Pt needs appointment then refill can be made Fax Received. Refill Completed. Hayley Long (R.M.A)   

## 2012-03-04 ENCOUNTER — Other Ambulatory Visit: Payer: Self-pay

## 2012-03-04 MED ORDER — AMLODIPINE BESYLATE 5 MG PO TABS: 5.0000 mg | ORAL_TABLET | Freq: Every day | ORAL | Status: DC

## 2012-03-06 ENCOUNTER — Encounter: Payer: Self-pay | Admitting: Cardiovascular Disease

## 2012-03-06 ENCOUNTER — Ambulatory Visit (INDEPENDENT_AMBULATORY_CARE_PROVIDER_SITE_OTHER): Payer: Medicare Other | Admitting: Cardiovascular Disease

## 2012-03-06 VITALS — BP 138/84 | HR 106 | Ht 66.0 in | Wt 131.0 lb

## 2012-03-06 DIAGNOSIS — I2510 Atherosclerotic heart disease of native coronary artery without angina pectoris: Secondary | ICD-10-CM

## 2012-03-06 DIAGNOSIS — I4891 Unspecified atrial fibrillation: Secondary | ICD-10-CM

## 2012-03-06 DIAGNOSIS — I10 Essential (primary) hypertension: Secondary | ICD-10-CM

## 2012-03-06 DIAGNOSIS — E785 Hyperlipidemia, unspecified: Secondary | ICD-10-CM

## 2012-03-06 MED ORDER — POTASSIUM CHLORIDE CRYS ER 10 MEQ PO TBCR: 10.0000 meq | EXTENDED_RELEASE_TABLET | Freq: Every day | ORAL | Status: DC

## 2012-03-06 MED ORDER — HYDROCHLOROTHIAZIDE 25 MG PO TABS: 25.0000 mg | ORAL_TABLET | Freq: Every day | ORAL | Status: DC

## 2012-03-06 MED ORDER — AMLODIPINE BESYLATE 5 MG PO TABS: 5.0000 mg | ORAL_TABLET | Freq: Every day | ORAL | Status: DC

## 2012-03-06 NOTE — Assessment & Plan Note (Signed)
She has done well. Her atrial fibrillation rate is slightly high on occasion. She may benefit from changing from amlodipine to diltiazem but because she's done so well for so long we have elected not to make any significant changes. She'll be moving to [place].  She need to find a  medical doctor and cardiologist out in [place].

## 2012-03-06 NOTE — Assessment & Plan Note (Signed)
Hayley Long has done well from a cardiac standpoint. She's not having any significant episodes of chest pain. She does have occasional episodes of chest pain and shortness breath.  She had a cardiac catheterization several years ago. She elected not to have coronary artery bypass grafting and in fact she is done fairly well on medical therapy. She's now 79 years old and I would not refer her for bypass grafting at this point.

## 2012-03-06 NOTE — Patient Instructions (Addendum)
Your physician recommends that you schedule a follow-up appointment in: as needed basis   Your physician recommends that you continue on your current medications as directed. Please refer to the Current Medication list given to you today.   
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
## 2012-03-12 ENCOUNTER — Other Ambulatory Visit: Payer: Self-pay | Admitting: *Deleted

## 2012-03-12 NOTE — Telephone Encounter (Signed)
Opened in Error.

## 2012-03-19 ENCOUNTER — Encounter: Payer: Self-pay | Admitting: Cardiovascular Disease

## 2012-07-01 ENCOUNTER — Other Ambulatory Visit: Payer: Self-pay | Admitting: *Deleted

## 2012-07-01 DIAGNOSIS — E785 Hyperlipidemia, unspecified: Secondary | ICD-10-CM

## 2012-07-01 DIAGNOSIS — I2510 Atherosclerotic heart disease of native coronary artery without angina pectoris: Secondary | ICD-10-CM

## 2012-07-01 DIAGNOSIS — I4891 Unspecified atrial fibrillation: Secondary | ICD-10-CM

## 2012-07-01 DIAGNOSIS — I10 Essential (primary) hypertension: Secondary | ICD-10-CM

## 2012-07-01 MED ORDER — POTASSIUM CHLORIDE CRYS ER 10 MEQ PO TBCR: 10.0000 meq | EXTENDED_RELEASE_TABLET | Freq: Every day | ORAL | Status: DC

## 2012-07-01 NOTE — Telephone Encounter (Signed)
Fax Received. Refill Completed. Athina Fahey Chowoe (R.M.A)   

## 2012-07-03 ENCOUNTER — Other Ambulatory Visit: Payer: Self-pay | Admitting: *Deleted

## 2012-07-03 DIAGNOSIS — I4891 Unspecified atrial fibrillation: Secondary | ICD-10-CM

## 2012-07-03 DIAGNOSIS — I10 Essential (primary) hypertension: Secondary | ICD-10-CM

## 2012-07-03 DIAGNOSIS — E785 Hyperlipidemia, unspecified: Secondary | ICD-10-CM

## 2012-07-03 DIAGNOSIS — I2510 Atherosclerotic heart disease of native coronary artery without angina pectoris: Secondary | ICD-10-CM

## 2012-07-03 MED ORDER — POTASSIUM CHLORIDE CRYS ER 10 MEQ PO TBCR: 10.0000 meq | EXTENDED_RELEASE_TABLET | Freq: Every day | ORAL | Status: DC

## 2012-08-22 ENCOUNTER — Encounter: Payer: Self-pay | Admitting: Cardiovascular Disease

## 2012-08-28 ENCOUNTER — Ambulatory Visit (INDEPENDENT_AMBULATORY_CARE_PROVIDER_SITE_OTHER): Payer: Self-pay | Admitting: Cardiovascular Disease

## 2012-08-28 ENCOUNTER — Encounter: Payer: Self-pay | Admitting: Cardiovascular Disease

## 2012-08-28 VITALS — BP 128/80 | HR 82 | Ht 65.0 in | Wt 124.0 lb

## 2012-08-28 DIAGNOSIS — I2510 Atherosclerotic heart disease of native coronary artery without angina pectoris: Secondary | ICD-10-CM

## 2012-08-28 DIAGNOSIS — I10 Essential (primary) hypertension: Secondary | ICD-10-CM

## 2012-08-28 DIAGNOSIS — E785 Hyperlipidemia, unspecified: Secondary | ICD-10-CM

## 2012-08-28 DIAGNOSIS — I4891 Unspecified atrial fibrillation: Secondary | ICD-10-CM

## 2012-08-28 LAB — LIPID PANEL
Cholesterol: 168 mg/dL (ref 0–200)
HDL: 53.7 mg/dL (ref >39.00)
VLDL: 20.6 mg/dL (ref 0.0–40.0)

## 2012-08-28 MED ORDER — HYDROCHLOROTHIAZIDE 25 MG PO TABS: 25.0000 mg | ORAL_TABLET | Freq: Every day | ORAL | Status: DC

## 2012-08-28 MED ORDER — POTASSIUM CHLORIDE CRYS ER 10 MEQ PO TBCR: 10.0000 meq | EXTENDED_RELEASE_TABLET | Freq: Every day | ORAL | Status: DC

## 2012-08-28 NOTE — Assessment & Plan Note (Signed)
She is stable.  She does not want to take coumadin.

## 2012-08-28 NOTE — Assessment & Plan Note (Signed)
Hayley Long is about the same.  She has developed mild dementia since I last saw her.  She has not had any chest pain.   Her son stopped her amlodipine because of hypotension and frequent falls.  She is doing better since that time.    Her BMP, CBC are OK.  We will draw fasting lipid profile today.    I will see her again in 6 months.
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
## 2012-08-28 NOTE — Patient Instructions (Addendum)
Your physician recommends that you return for lab work in: today  Your physician wants you to follow-up in: 6 months  You will receive a reminder letter in the mail two months in advance. If you don't receive a letter, please call our office to schedule the follow-up appointment.  Your physician recommends that you continue on your current medications as directed. Please refer to the Current Medication list given to you today.    

## 2012-08-30 ENCOUNTER — Encounter: Payer: Self-pay | Admitting: Cardiovascular Disease

## 2012-09-04 ENCOUNTER — Other Ambulatory Visit: Payer: Self-pay

## 2012-12-05 ENCOUNTER — Other Ambulatory Visit: Payer: Self-pay

## 2013-02-12 ENCOUNTER — Ambulatory Visit: Payer: Medicare Other | Admitting: Cardiovascular Disease

## 2013-02-12 ENCOUNTER — Ambulatory Visit: Payer: Self-pay | Admitting: Nurse Practitioner

## 2013-02-28 ENCOUNTER — Ambulatory Visit (INDEPENDENT_AMBULATORY_CARE_PROVIDER_SITE_OTHER): Payer: Medicare Other | Admitting: Cardiovascular Disease

## 2013-02-28 ENCOUNTER — Encounter: Payer: Self-pay | Admitting: Cardiovascular Disease

## 2013-02-28 VITALS — BP 140/86 | HR 84 | Ht 65.0 in | Wt 127.8 lb

## 2013-02-28 DIAGNOSIS — I2510 Atherosclerotic heart disease of native coronary artery without angina pectoris: Secondary | ICD-10-CM

## 2013-02-28 MED ORDER — POTASSIUM CHLORIDE CRYS ER 20 MEQ PO TBCR: 20.0000 meq | EXTENDED_RELEASE_TABLET | Freq: Every day | ORAL | Status: DC

## 2013-02-28 NOTE — Patient Instructions (Signed)
Your physician wants you to follow-up in: 1 YEAR.  You will receive a reminder letter in the mail two months in advance. If you don't receive a letter, please call our office to schedule the follow-up appointment.  Your physician recommends that you continue on your current medications as directed. Please refer to the Current Medication list given to you today.  

## 2013-02-28 NOTE — Assessment & Plan Note (Signed)
Hayley Long is doing fine. She's not having any significant episodes of chest pain. She has nitroglycerin available if needed.  I will see her again in one year. At this point, given her age and her dementia, I do not have any plans for any further coronary evaluation.  Will continue to treat her fairly conservatively.  She has been seeing Dr. Clarene DukeLittle for primary care. We will transition monitoring of her electrolytes and management of her ACTZ and potassium medications to him. I'll see her in one year.
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
## 2013-03-26 ENCOUNTER — Other Ambulatory Visit: Payer: Self-pay | Admitting: Cardiovascular Disease

## 2013-07-31 ENCOUNTER — Emergency Department (HOSPITAL_COMMUNITY): Payer: Medicare Other

## 2013-07-31 ENCOUNTER — Encounter (HOSPITAL_COMMUNITY): Payer: Self-pay | Admitting: Emergency Medicine

## 2013-07-31 ENCOUNTER — Observation Stay (HOSPITAL_COMMUNITY)
Admission: EM | Admit: 2013-07-31 | Discharge: 2013-08-01 | Disposition: A | Discharge status: Home / Self Care | Payer: Medicare Other | Attending: Internal Medicine | Admitting: Internal Medicine

## 2013-07-31 DIAGNOSIS — Z9119 Patient's noncompliance with other medical treatment and regimen: Secondary | ICD-10-CM | POA: Insufficient documentation

## 2013-07-31 DIAGNOSIS — Z7982 Long term (current) use of aspirin: Secondary | ICD-10-CM | POA: Insufficient documentation

## 2013-07-31 DIAGNOSIS — I482 Chronic atrial fibrillation, unspecified: Secondary | ICD-10-CM

## 2013-07-31 DIAGNOSIS — Z66 Do not resuscitate: Secondary | ICD-10-CM | POA: Insufficient documentation

## 2013-07-31 DIAGNOSIS — Z79899 Other long term (current) drug therapy: Secondary | ICD-10-CM | POA: Insufficient documentation

## 2013-07-31 DIAGNOSIS — S32009A Unspecified fracture of unspecified lumbar vertebra, initial encounter for closed fracture: Principal | ICD-10-CM

## 2013-07-31 DIAGNOSIS — I4891 Unspecified atrial fibrillation: Secondary | ICD-10-CM | POA: Diagnosis present

## 2013-07-31 DIAGNOSIS — E876 Hypokalemia: Secondary | ICD-10-CM | POA: Diagnosis present

## 2013-07-31 DIAGNOSIS — E785 Hyperlipidemia, unspecified: Secondary | ICD-10-CM | POA: Diagnosis present

## 2013-07-31 DIAGNOSIS — W07XXXA Fall from chair, initial encounter: Secondary | ICD-10-CM | POA: Insufficient documentation

## 2013-07-31 DIAGNOSIS — Z9089 Acquired absence of other organs: Secondary | ICD-10-CM | POA: Insufficient documentation

## 2013-07-31 DIAGNOSIS — I2510 Atherosclerotic heart disease of native coronary artery without angina pectoris: Secondary | ICD-10-CM | POA: Diagnosis present

## 2013-07-31 DIAGNOSIS — F0390 Unspecified dementia without behavioral disturbance: Secondary | ICD-10-CM | POA: Insufficient documentation

## 2013-07-31 DIAGNOSIS — Z91199 Patient's noncompliance with other medical treatment and regimen due to unspecified reason: Secondary | ICD-10-CM | POA: Insufficient documentation

## 2013-07-31 DIAGNOSIS — S32000A Wedge compression fracture of unspecified lumbar vertebra, initial encounter for closed fracture: Secondary | ICD-10-CM

## 2013-07-31 DIAGNOSIS — I10 Essential (primary) hypertension: Secondary | ICD-10-CM | POA: Diagnosis present

## 2013-07-31 LAB — CBC WITH DIFFERENTIAL/PLATELET
BASOS PCT: 1 % (ref 0–1)
Basophils Absolute: 0 10*3/uL (ref 0.0–0.1)
Eosinophils Absolute: 0.1 10*3/uL (ref 0.0–0.7)
Eosinophils Relative: 2 % (ref 0–5)
HEMATOCRIT: 41.1 % (ref 36.0–46.0)
HEMOGLOBIN: 14.8 g/dL (ref 12.0–15.0)
LYMPHS ABS: 1.8 10*3/uL (ref 0.7–4.0)
LYMPHS PCT: 31 % (ref 12–46)
MCH: 30.3 pg (ref 26.0–34.0)
MCHC: 36 g/dL (ref 30.0–36.0)
MCV: 84.2 fL (ref 78.0–100.0)
MONO ABS: 0.6 10*3/uL (ref 0.1–1.0)
MONOS PCT: 11 % (ref 3–12)
NEUTROS ABS: 3.2 10*3/uL (ref 1.7–7.7)
NEUTROS PCT: 55 % (ref 43–77)
Platelets: 292 10*3/uL (ref 150–400)
RBC: 4.88 MIL/uL (ref 3.87–5.11)
RDW: 12.8 % (ref 11.5–15.5)
WBC: 5.7 10*3/uL (ref 4.0–10.5)

## 2013-07-31 LAB — URINALYSIS, ROUTINE W REFLEX MICROSCOPIC
BILIRUBIN URINE: NEGATIVE
GLUCOSE, UA: NEGATIVE mg/dL
HGB URINE DIPSTICK: NEGATIVE
Ketones, ur: NEGATIVE mg/dL
Leukocytes, UA: NEGATIVE
Nitrite: NEGATIVE
PH: 5 (ref 5.0–8.0)
Protein, ur: NEGATIVE mg/dL
SPECIFIC GRAVITY, URINE: 1.011 (ref 1.005–1.030)
UROBILINOGEN UA: 0.2 mg/dL (ref 0.0–1.0)

## 2013-07-31 LAB — I-STAT CHEM 8, ED
BUN: 21 mg/dL (ref 6–23)
CALCIUM ION: 1.15 mmol/L (ref 1.13–1.30)
CHLORIDE: 98 meq/L (ref 96–112)
CREATININE: 0.8 mg/dL (ref 0.50–1.10)
GLUCOSE: 100 mg/dL — AB (ref 70–99)
HEMATOCRIT: 47 % — AB (ref 36.0–46.0)
Hemoglobin: 16 g/dL — ABNORMAL HIGH (ref 12.0–15.0)
POTASSIUM: 2.7 meq/L — AB (ref 3.7–5.3)
Sodium: 134 mEq/L — ABNORMAL LOW (ref 137–147)
TCO2: 26 mmol/L (ref 0–100)

## 2013-07-31 MED ORDER — SODIUM CHLORIDE 0.9 % IV BOLUS (SEPSIS)
500.0000 mL | Freq: Once | INTRAVENOUS | Status: AC
  Administered 2013-07-31: 500 mL via INTRAVENOUS

## 2013-07-31 MED ORDER — CALCIUM CARBONATE ANTACID 500 MG PO CHEW
1.0000 | CHEWABLE_TABLET | Freq: Two times a day (BID) | ORAL | Status: DC
  Administered 2013-07-31 – 2013-08-01 (×2): 200 mg via ORAL
  Filled 2013-07-31 (×4): qty 1

## 2013-07-31 MED ORDER — METHOCARBAMOL 1000 MG/10ML IJ SOLN
500.0000 mg | Freq: Three times a day (TID) | INTRAVENOUS | Status: DC | PRN
  Administered 2013-07-31: 500 mg via INTRAVENOUS
  Filled 2013-07-31: qty 5

## 2013-07-31 MED ORDER — DONEPEZIL HCL 10 MG PO TABS
10.0000 mg | ORAL_TABLET | Freq: Every day | ORAL | Status: DC
  Administered 2013-07-31: 10 mg via ORAL
  Filled 2013-07-31 (×2): qty 1

## 2013-07-31 MED ORDER — POTASSIUM CHLORIDE CRYS ER 20 MEQ PO TBCR
20.0000 meq | EXTENDED_RELEASE_TABLET | Freq: Every day | ORAL | Status: DC
  Administered 2013-07-31 – 2013-08-01 (×2): 20 meq via ORAL
  Filled 2013-07-31 (×2): qty 1

## 2013-07-31 MED ORDER — ENOXAPARIN SODIUM 40 MG/0.4ML ~~LOC~~ SOLN
40.0000 mg | SUBCUTANEOUS | Status: DC
  Administered 2013-07-31: 40 mg via SUBCUTANEOUS
  Filled 2013-07-31 (×2): qty 0.4

## 2013-07-31 MED ORDER — SENNOSIDES-DOCUSATE SODIUM 8.6-50 MG PO TABS: 1.0000 | ORAL_TABLET | Freq: Two times a day (BID) | ORAL | Status: DC | PRN

## 2013-07-31 MED ORDER — HYDRALAZINE HCL 25 MG PO TABS
25.0000 mg | ORAL_TABLET | Freq: Three times a day (TID) | ORAL | Status: DC | PRN
  Filled 2013-07-31: qty 1

## 2013-07-31 MED ORDER — FLEET ENEMA 7-19 GM/118ML RE ENEM
1.0000 | ENEMA | Freq: Once | RECTAL | Status: AC
  Administered 2013-07-31: 1 via RECTAL
  Filled 2013-07-31: qty 1

## 2013-07-31 MED ORDER — KETOROLAC TROMETHAMINE 15 MG/ML IJ SOLN
15.0000 mg | Freq: Four times a day (QID) | INTRAMUSCULAR | Status: DC | PRN
  Administered 2013-07-31 – 2013-08-01 (×2): 15 mg via INTRAVENOUS
  Filled 2013-07-31 (×2): qty 1

## 2013-07-31 MED ORDER — POTASSIUM CHLORIDE CRYS ER 20 MEQ PO TBCR
40.0000 meq | EXTENDED_RELEASE_TABLET | Freq: Once | ORAL | Status: AC
  Administered 2013-07-31: 40 meq via ORAL
  Filled 2013-07-31: qty 2

## 2013-07-31 MED ORDER — ONDANSETRON HCL 4 MG PO TABS: 4.0000 mg | ORAL_TABLET | Freq: Four times a day (QID) | ORAL | Status: DC | PRN

## 2013-07-31 MED ORDER — ACETAMINOPHEN 325 MG PO TABS
650.0000 mg | ORAL_TABLET | Freq: Four times a day (QID) | ORAL | Status: DC | PRN
  Administered 2013-07-31 – 2013-08-01 (×2): 650 mg via ORAL
  Filled 2013-07-31 (×2): qty 2

## 2013-07-31 MED ORDER — SODIUM CHLORIDE 0.9 % IJ SOLN
3.0000 mL | Freq: Two times a day (BID) | INTRAMUSCULAR | Status: DC
  Administered 2013-07-31: 3 mL via INTRAVENOUS

## 2013-07-31 MED ORDER — ASPIRIN EC 81 MG PO TBEC
81.0000 mg | DELAYED_RELEASE_TABLET | Freq: Every day | ORAL | Status: DC
  Administered 2013-07-31 – 2013-08-01 (×2): 81 mg via ORAL
  Filled 2013-07-31 (×2): qty 1

## 2013-07-31 MED ORDER — ONDANSETRON HCL 4 MG/2ML IJ SOLN: 4.0000 mg | Freq: Four times a day (QID) | INTRAMUSCULAR | Status: DC | PRN

## 2013-07-31 MED ORDER — HYDROCODONE-ACETAMINOPHEN 5-325 MG PO TABS
1.0000 | ORAL_TABLET | Freq: Once | ORAL | Status: AC
  Administered 2013-07-31: 1 via ORAL
  Filled 2013-07-31: qty 1

## 2013-07-31 MED ORDER — ACETAMINOPHEN 650 MG RE SUPP: 650.0000 mg | Freq: Four times a day (QID) | RECTAL | Status: DC | PRN

## 2013-07-31 MED ORDER — NITROGLYCERIN 0.4 MG SL SUBL: 0.4000 mg | SUBLINGUAL_TABLET | SUBLINGUAL | Status: DC | PRN

## 2013-07-31 MED ORDER — ADULT MULTIVITAMIN W/MINERALS CH
1.0000 | ORAL_TABLET | Freq: Every day | ORAL | Status: DC
  Administered 2013-07-31 – 2013-08-01 (×2): 1 via ORAL
  Filled 2013-07-31 (×2): qty 1

## 2013-07-31 NOTE — Progress Notes (Signed)
  CARE MANAGEMENT ED NOTE 07/31/2013  Patient:  Hayley Long,Hayley Long   Account Number:  [id number]  Date Initiated:  07/31/2013  Documentation initiated by:  Edd ArbourGIBBS,Riata Ikeda  Subjective/Objective Assessment:   80 yr old blue medicare covered [place] resident fell 2 weeks ago c/o back, leg pain     Subjective/Objective Assessment Detail:   pcp Catha GosselinKevin Little  Son at bedside stated pt had services in 2013 but related to pt dementia she became a bit agitated  Pt noted to cry out in pain and grimace when turning to her left side in bed Cm discussed with ED RN  K 2.7 HR 101 x 1  Imaging shows new compression of the superior and inferior endplates of the body of L3. The overall loss of height of the vertebral body is approximately 10%.  There is new inferior endplate depression of L2 possibly related  to a prominent Schmorl node.  There are chronic degenerative changes of the L1-2, L4-5, and  L5-S1 discs. There is chronic superior endplate depression of L4.     Action/Plan:   ED CM spoke with pt and son in ED about possible home health and provided a list of [place] home health agencies for choice assistance   Action/Plan Detail:   Anticipated DC Date:       Status Recommendation to Physician:   Result of Recommendation:    Other ED Services  Consult Working Plan    DC [facility]  Other  Outpatient Services - Pt will follow up    Choice offered to / List presented to:            Status of service:  Completed, signed off  ED Comments:   ED Comments Detail:

## 2013-07-31 NOTE — Progress Notes (Signed)
Choice provided   HOME HEALTH AGENCIES SERVING [place]   Agencies that are Medicare-Certified and are affiliated with [facility]  Telephone Number Address  [facility]   [facility] has ownership interest in this company; however, you are under no obligation to use this agency. [phone] or  [phone] [address] [place], [place] [address] [email or web address]   Agencies that are Medicare-Certified and are not affiliated with The  System                                                                                 Home Health Agency Telephone Number Address  [facility] [phone] Fax [phone] [address], [address] [place], [place]  [address] [email or web address]  [facility] [phone] or [phone] Fax [phone] [address] [place], [place] [address] [email or web address]  [facility] [phone] Fax [phone] [address] [place], [place] [address] [email or web address]  [facility] [phone] Fax [phone] [address] [place], [place]  [address] [email or web address]  [facility] [phone] Fax [phone] [address] [place], [place] [address] [email or web address]  [facility] [phone] [address] [place], [place] [address] [email or web address]  [facility] [phone]  [address] [place], [place] [address] [email or web address]  [facility] [phone] or [phone] Fax number [phone] [address] , [place]  [address] [email or web address]  [facility] [phone] Fax [phone] [address] [place], [place]   [address]  [facility]  [phone] Fax [phone] [address] [place],  [address] [email or web address]   

## 2013-07-31 NOTE — ED Notes (Signed)
Bed: WA10 Expected date:  Expected time:  Means of arrival:  Comments: EMS fall 

## 2013-07-31 NOTE — ED Notes (Signed)
Patient transported to X-ray 
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
## 2013-07-31 NOTE — ED Notes (Signed)
Dr. Anitra LauthPlunkett notified of low K-2.7

## 2013-07-31 NOTE — ED Notes (Signed)
Per EMS- Patient fell 2 weeks ago. Patient has been ambulatory with a walker. Patient c/o increased pain in both legs to the point that she was unable to get out of bed this AM. Patient has been taking Ibuprofen for pain according to the son.
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
## 2013-07-31 NOTE — ED Notes (Addendum)
Pt ambulated 20 ft with 2 staff assist. Son reports pt normally has trouble "starting to walk" then improves as walking. MD Plunkett aware. Per primary nurse, son reports pt seems more confused with narcotic previously administered for pain relief but has seen the same in past use. MD Plunkett aware.   Prior to medication intervention pt refuses to walk due to pain. Pt ambulates without much resistance post medication administration.

## 2013-07-31 NOTE — Progress Notes (Signed)
Discussed admission status with Dr. Dhungel. 

## 2013-08-01 LAB — BASIC METABOLIC PANEL
Anion gap: 14 (ref 5–15)
BUN: 28 mg/dL — ABNORMAL HIGH (ref 6–23)
CO2: 25 meq/L (ref 19–32)
Calcium: 9.2 mg/dL (ref 8.4–10.5)
Chloride: 100 mEq/L (ref 96–112)
Creatinine, Ser: 0.8 mg/dL (ref 0.50–1.10)
GFR calc non Af Amer: 63 mL/min — ABNORMAL LOW (ref >90)
GFR, EST AFRICAN AMERICAN: 73 mL/min — AB (ref >90)
Glucose, Bld: 99 mg/dL (ref 70–99)
POTASSIUM: 3 meq/L — AB (ref 3.7–5.3)
SODIUM: 139 meq/L (ref 137–147)

## 2013-08-01 MED ORDER — ACETAMINOPHEN 500 MG PO TABS: 500.0000 mg | ORAL_TABLET | Freq: Four times a day (QID) | ORAL | Status: DC

## 2013-08-01 MED ORDER — SENNOSIDES-DOCUSATE SODIUM 8.6-50 MG PO TABS
1.0000 | ORAL_TABLET | Freq: Two times a day (BID) | ORAL | Status: AC | PRN
Start: 2013-08-01 — End: ?

## 2013-08-01 MED ORDER — POTASSIUM CHLORIDE CRYS ER 20 MEQ PO TBCR
40.0000 meq | EXTENDED_RELEASE_TABLET | Freq: Once | ORAL | Status: AC
  Administered 2013-08-01: 40 meq via ORAL
  Filled 2013-08-01: qty 2

## 2013-08-01 MED ORDER — METHOCARBAMOL 500 MG PO TABS: 500.0000 mg | ORAL_TABLET | Freq: Three times a day (TID) | ORAL | Status: DC | PRN

## 2013-08-01 NOTE — Progress Notes (Signed)
Spoke with pt's son and daughter at bedside, [facility] and no DME at present time. Referral called to [facility] in house rep.

## 2013-08-01 NOTE — Discharge Summary (Signed)
Physician Discharge Summary  Hayley Long Jacinta Shoe WUJ:[id number] DOB: [birth year] DOA: 07/31/2013  PCP: Mickie Hillier, MD  Admit date: 07/31/2013 Discharge date: 08/01/2013  Time spent: 25 minutes  Recommendations for Outpatient Follow-up:  Discharge home with home health PT Followup with PCP in one week. Recheck vitamin D level sent on admission  Discharge Diagnoses:  Principal Problem:   Lumbar vertebral fracture   Active Problems:   Coronary artery disease   Atrial fibrillation   Hyperlipidemia   Hypertension   Hypokalemia   Discharge Condition: Fair  Diet recommendation: Regular  Filed Weights   07/31/13 1601  Weight: 53.7 kg (118 lb 6.2 oz)    History of present illness:  Please refer to admission H&P for details, but in brief, 80 year old female with CAD, A. fib not on Coumadin due to noncompliance, hypertension, mild dementia who fell from the chair about 2 weeks back and has been having progressive pain in her back with limited ambulation and progressive pain . Patient had baseline also the walker but has not been able to ambulate properly for last 2 days and the son is unable to provide her with much help. The fall was mechanical and without any syncopal episode or loss of consciousness. She denies any tingling or numbness to her extremities, bowel or urinary symptoms. She reports having difficulty getting up from bed due to pain.  Patient denies headache, dizziness, fever, chills, nausea , vomiting, chest pain, palpitations, SOB, abdominal pain, bowel or urinary symptoms. Denies change in weight or appetite. Son reports patient to be taking Tylenol and Motrin without much help.  Course in ED  Patient's vitals were stable. Blood work done showed normal CBC, chemistry showed sodium of 134, K. of 2.7, chloride 98, BUN of 21 and creatinine of 0.8. X-ray of the lumbar spine shows a new compression fracture of the superior and inferior endplates of the body of the with loss of  height of vertebral body of 10%. Also chronic degenerative changes of the lumbar vertebra. X-ray of the sacrum and coccyx shows no fractures.  Given persistent pain and unable to ambulate at home hospitalist consulted for admission would need for PT and pain control. Patient given low dose Vicodin in the ED after which she became somnolent.   Hospital Course:  Lumbar compression fracture  Secondary to mechanical fall with 10% loss of vertebral body height over L3. Chronic degenerative changes of the L1-L2 and L4-L5.  Patient given low dose Vicodin after which she became sedated. She is allergic to tramadol which causes contusions and delirium.  -Patient placed on scheduled Tylenol and when necessary Toradol. Also added when necessary Robaxin which improved her spasms.  -Seen by physical therapy and patient was able to get up with minimal assist and ambulate 200 feet he did PT recommended home with home health PT. She has a walker at home. Son reported that he would be able to take care of her for almost 24 hours for next few days. -I have instructed Patient's son and daughter that if  patient continues to have worsened pain or has impaired mobility and they have difficulty take care of her at home they  would need to talk to her PCP for options of skilled nursing facility. -Patient will be discharged on scheduled Tylenol 500 mg every 6 hours, ibuprofen 400 mg every 8 hours as needed (often needs) and oral Robaxin 500 mg tid prn .    Active Problems:  Coronary artery disease  Patient has chronic total  occlusion of LAD, not a candidate for PCI. She was offered surgery many years ago but refused. Continue aspirin . Follows with Dr. Melburn PopperNasher.    Atrial fibrillation  Rate controlled. Not on Coumadin due to noncompliance.   Dementia  Mild  Continue Aricept   Hypokalemia  Patient on HCTZ which  was held. Replenishd with KCL.  stable on telemetry. Continue potassium supplement at home.    Hypertension  Resume HCTZ upon discharge.    CODE STATUS: DO NOT RESUSCITATE Family communication: Son and daughter at bedside   Procedures:  None  Consultations:  Physical therapy  Discharge Exam: Filed Vitals:   08/01/13 0500  BP: 128/73  Pulse: 82  Temp: 97.3 F (36.3 C)  Resp: 18    General:  elderly female in no acute distress HEENT: No pallor, muscle to mucosa Chest: Clear to auscultation bilaterally, no added sounds CVS: S1 and S2 irregular, no murmurs rub or gallop Abdomen: Soft, nontender, nondistended, bowel sounds present Extremities: Warm, no back tenderness, SLTR negative. Normal range of motion of the hips CNS: AAO x3   Discharge Instructions You were cared for by a hospitalist during your hospital stay. If you have any questions about your discharge medications or the care you received while you were in the hospital after you are discharged, you can call the unit and asked to speak with the hospitalist on call if the hospitalist that took care of you is not available. Once you are discharged, your primary care physician will handle any further medical issues. Please note that NO REFILLS for any discharge medications will be authorized once you are discharged, as it is imperative that you return to your primary care physician (or establish a relationship with a primary care physician if you do not have one) for your aftercare needs so that they can reassess your need for medications and monitor your lab values.     Medication List         acetaminophen 500 MG tablet  Commonly known as:  TYLENOL  Take 1 tablet (500 mg total) by mouth every 6 (six) hours.     aspirin 81 MG tablet  Take 81 mg by mouth daily.     calcium carbonate 200 MG capsule  Take 200 mg by mouth 2 (two) times daily with a meal.     donepezil 10 MG tablet  Commonly known as:  ARICEPT  Take 10 mg by mouth at bedtime.     hydrochlorothiazide 25 MG tablet  Commonly known as:   HYDRODIURIL  Take 25 mg by mouth daily.     ibuprofen 200 MG tablet  Commonly known as:  ADVIL,MOTRIN  Take 400 mg by mouth every 6 (six) hours as needed for mild pain or moderate pain.     methocarbamol 500 MG tablet  Commonly known as:  ROBAXIN  Take 1 tablet (500 mg total) by mouth every 8 (eight) hours as needed for muscle spasms.     multivitamin tablet  Take 1 tablet by mouth daily.     nitroGLYCERIN 0.4 MG SL tablet  Commonly known as:  NITROSTAT  Place 0.4 mg under the tongue every 5 (five) minutes as needed. Chest pain     OSTEO BI-FLEX JOINT SHIELD PO  Take 1 tablet by mouth 2 (two) times daily.     OVER THE COUNTER MEDICATION  Take 1 tablet by mouth daily. NATTOKIASE-HERBAL SUPPLEMENT TO PREVENT ARTERY  BLOCKAGES     potassium chloride SA 20 MEQ  tablet  Commonly known as:  K-DUR,KLOR-CON  Take 20 mEq by mouth daily.     senna-docusate 8.6-50 MG per tablet  Commonly known as:  Senokot-S  Take 1 tablet by mouth 2 (two) times daily as needed for mild constipation.       Allergies  Allergen Reactions  . Corticosteroids Swelling  . Tramadol Other (See Comments)    Confusion & violent reactions       Follow-up Information   Follow up with Mickie Hillier, MD. Schedule an appointment as soon as possible for a visit in 1 week.   Specialty:  Family Medicine   Contact information:   [address] [place] [address] [phone]        The results of significant diagnostics from this hospitalization (including imaging, microbiology, ancillary and laboratory) are listed below for reference.    Significant Diagnostic Studies: Dg Lumbar Spine Complete  07/31/2013   CLINICAL DATA:  Low back and lower extremity radicular symptoms status post fall 2 weeks ago  EXAM: LUMBAR SPINE - COMPLETE 4+ VIEW  COMPARISON:  An abdominal series of July 23, 2011  FINDINGS: There is stable partial compression of the body of L3. A prominent Schmorl's node of the superior  endplate contributes to the appearance. There is new compression of the body of L3 along the mid portions of the superior and inferior endplates. There is new abnormal inferior endplate depression at L2. There is no spondylolisthesis. There is stable disc space narrowing at L1-2 and at L4-5 and at L5-S1. The transverse processes are poorly visualized. The observed portions of the sacrum are normal.  IMPRESSION: 1. There is new compression of the superior and inferior endplates of the body of L3. The overall loss of height of the vertebral body is approximately 10%. 2. There is new inferior endplate depression of L2 possibly related to a prominent Schmorl node. 3. There are chronic degenerative changes of the L1-2, L4-5, and L5-S1 discs. There is chronic superior endplate depression of L4.   Electronically Signed   By: David  [place]   On: 07/31/2013 10:56   Dg Sacrum/coccyx  07/31/2013   CLINICAL DATA:  Status post fall 2 weeks ago with pain radiating to bilateral lower extremity  EXAM: SACRUM AND COCCYX - 2+ VIEW  COMPARISON:  None.  FINDINGS: There is no acute fracture or dislocation. Chronic changes of the distal sacrum are noted.  IMPRESSION: No acute fracture or dislocation.  Chronic changes of distal sacrum.   Electronically Signed   By: Sherian Rein M.D.   On: 07/31/2013 10:59    Microbiology: No results found for this or any previous visit (from the past 240 hour(s)).   Labs: Basic Metabolic Panel:  Recent Labs Lab 07/31/13 1110 08/01/13 0450  NA 134* 139  K 2.7* 3.0*  CL 98 100  CO2  --  25  GLUCOSE 100* 99  BUN 21 28*  CREATININE 0.80 0.80  CALCIUM  --  9.2   Liver Function Tests: No results found for this basename: AST, ALT, ALKPHOS, BILITOT, PROT, ALBUMIN,  in the last 168 hours No results found for this basename: LIPASE, AMYLASE,  in the last 168 hours No results found for this basename: AMMONIA,  in the last 168 hours CBC:  Recent Labs Lab 07/31/13 1058 07/31/13 1110   WBC 5.7  --   NEUTROABS 3.2  --   HGB 14.8 16.0*  HCT 41.1 47.0*  MCV 84.2  --   PLT 292  --  Cardiac Enzymes: No results found for this basename: CKTOTAL, CKMB, CKMBINDEX, TROPONINI,  in the last 168 hours BNP: BNP (last 3 results) No results found for this basename: PROBNP,  in the last 8760 hours CBG: No results found for this basename: GLUCAP,  in the last 168 hours     Signed:  Dorena Dorfman  [facility] 08/01/2013, 10:54 AM

## 2013-08-01 NOTE — Evaluation (Signed)
Physical Therapy Evaluation Patient Details Name: Hayley FateDorothy P Long MRN: [id number] DOB: July 10, 1920 Today's Date: 08/01/2013   History of Present Illness  80 yo female admitted with acute L2, L3 comp fractures; chronic L4 comp fx. Hx of A fib, HTN, CAD, dementia.   Clinical Impression  On eval, pt required Min assist for mobility-able to ambulate ~100 feet with rolling walker. Pt is unsteady however she tolerated activity fairly well. Family present in room during eval-son reports he can provide near 24 hour care "for a few days" (son does work).     Follow Up Recommendations Home health PT;Supervision/Assistance - 24 hour    Equipment Recommendations  None recommended by PT     Recommendations for Other Services       Precautions / Restrictions Precautions Precautions: Fall Restrictions Weight Bearing Restrictions: No      Mobility  Bed Mobility Overal bed mobility: Needs Assistance Bed Mobility: Supine to Sit     Supine to sit: Min assist     General bed mobility comments: assist for trunk to upright. Increased time.   Transfers Overall transfer level: Needs assistance Equipment used: Rolling walker (2 wheeled) Transfers: Sit to/from Stand Sit to Stand: Min assist         General transfer comment: Assist to rise, stabilize, control descent. VCS safety, technique, hand placement.   Ambulation/Gait Ambulation/Gait assistance: Min assist Ambulation Distance (Feet): 100 Feet Assistive device: Rolling walker (2 wheeled) Gait Pattern/deviations: Step-through pattern;Wide base of support     General Gait Details: Unsteady throughout ambulation. Assist to stabilize needed.   Stairs            Wheelchair Mobility    Modified Rankin (Stroke Patients Only)       Balance                                             Pertinent Vitals/Pain Back " a little" with activity.     Home Living Family/patient expects to be discharged to::  Private residence Living Arrangements: Children Available Help at Discharge: Family (close to 24 hours. Son does work) Type of Home: [facility] Home Access: Stairs to enter Entrance Stairs-Rails: None Secretary/administratorntrance Stairs-Number of Steps: 1 Home Layout: One level Home Equipment: Environmental consultantWalker - 2 wheels;Cane - single point      Prior Function Level of Independence: Independent with assistive device(s)               Hand Dominance        Extremity/Trunk Assessment   Upper Extremity Assessment: Generalized weakness           Lower Extremity Assessment: Generalized weakness      Cervical / Trunk Assessment: Kyphotic  Communication   Communication: HOH  Cognition Arousal/Alertness: Awake/alert Behavior During Therapy: WFL for tasks assessed/performed Overall Cognitive Status: History of cognitive impairments - at baseline                      General Comments      Exercises        Assessment/Plan    PT Assessment Patient needs continued PT services  PT Diagnosis Difficulty walking;Generalized weakness;Acute pain   PT Problem List Decreased strength;Decreased activity tolerance;Decreased balance;Decreased mobility;Pain;Decreased knowledge of use of DME;Decreased cognition;Decreased safety awareness  PT Treatment Interventions DME instruction;Gait training;Functional mobility training;Therapeutic activities;Patient/family education;Therapeutic exercise;Balance training   PT Goals (  Current goals can be found in the Care Plan section) Acute Rehab PT Goals Patient Stated Goal: home PT Goal Formulation: With family Time For Goal Achievement: 08/15/13 Potential to Achieve Goals: Good    Frequency Min 3X/week   Barriers to discharge        Co-evaluation               End of Session Equipment Utilized During Treatment: Gait belt Activity Tolerance: Patient tolerated treatment well Patient left: in chair;with call bell/phone within reach;with family/visitor  present      Functional Assessment Tool Used: clinical judgement Functional Limitation: Mobility: Walking and moving around Mobility: Walking and Moving Around Current Status [phone](G8978): At least 20 percent but less than 40 percent impaired, limited or restricted Mobility: Walking and Moving Around Goal Status [phone](G8979): At least 1 percent but less than 20 percent impaired, limited or restricted    Time: 0840-0856 PT Time Calculation (min): 16 min   Charges:   PT Evaluation $Initial PT Evaluation Tier I: 1 Procedure PT Treatments $Gait Training: 8-22 mins   PT G Codes:   Functional Assessment Tool Used: clinical judgement Functional Limitation: Mobility: Walking and moving around    R.R. DonnelleyJannie Aryia Delira, MPT Pager: [phone]

## 2013-08-05 ENCOUNTER — Inpatient Hospital Stay (HOSPITAL_COMMUNITY)
Admission: EM | Admit: 2013-08-05 | Discharge: 2013-08-07 | DRG: 683 | Disposition: A | Discharge status: Skilled Nursing Facility | Payer: Medicare Other | Attending: Internal Medicine | Admitting: Internal Medicine

## 2013-08-05 ENCOUNTER — Emergency Department (HOSPITAL_COMMUNITY): Payer: Medicare Other

## 2013-08-05 ENCOUNTER — Encounter (HOSPITAL_COMMUNITY): Payer: Self-pay | Admitting: Emergency Medicine

## 2013-08-05 DIAGNOSIS — F0391 Unspecified dementia with behavioral disturbance: Secondary | ICD-10-CM | POA: Diagnosis present

## 2013-08-05 DIAGNOSIS — I2510 Atherosclerotic heart disease of native coronary artery without angina pectoris: Secondary | ICD-10-CM | POA: Diagnosis present

## 2013-08-05 DIAGNOSIS — I2582 Chronic total occlusion of coronary artery: Secondary | ICD-10-CM | POA: Diagnosis present

## 2013-08-05 DIAGNOSIS — K5900 Constipation, unspecified: Secondary | ICD-10-CM | POA: Diagnosis present

## 2013-08-05 DIAGNOSIS — Z91199 Patient's noncompliance with other medical treatment and regimen due to unspecified reason: Secondary | ICD-10-CM | POA: Diagnosis not present

## 2013-08-05 DIAGNOSIS — N3289 Other specified disorders of bladder: Secondary | ICD-10-CM | POA: Diagnosis present

## 2013-08-05 DIAGNOSIS — N1330 Unspecified hydronephrosis: Secondary | ICD-10-CM | POA: Diagnosis present

## 2013-08-05 DIAGNOSIS — F03918 Unspecified dementia, unspecified severity, with other behavioral disturbance: Secondary | ICD-10-CM | POA: Diagnosis present

## 2013-08-05 DIAGNOSIS — F19921 Other psychoactive substance use, unspecified with intoxication with delirium: Secondary | ICD-10-CM | POA: Diagnosis present

## 2013-08-05 DIAGNOSIS — I4891 Unspecified atrial fibrillation: Secondary | ICD-10-CM | POA: Diagnosis present

## 2013-08-05 DIAGNOSIS — S32000A Wedge compression fracture of unspecified lumbar vertebra, initial encounter for closed fracture: Secondary | ICD-10-CM

## 2013-08-05 DIAGNOSIS — R404 Transient alteration of awareness: Secondary | ICD-10-CM | POA: Diagnosis present

## 2013-08-05 DIAGNOSIS — R109 Unspecified abdominal pain: Secondary | ICD-10-CM | POA: Diagnosis present

## 2013-08-05 DIAGNOSIS — S32039D Unspecified fracture of third lumbar vertebra, subsequent encounter for fracture with routine healing: Secondary | ICD-10-CM

## 2013-08-05 DIAGNOSIS — S32009A Unspecified fracture of unspecified lumbar vertebra, initial encounter for closed fracture: Secondary | ICD-10-CM | POA: Diagnosis present

## 2013-08-05 DIAGNOSIS — Z9119 Patient's noncompliance with other medical treatment and regimen: Secondary | ICD-10-CM

## 2013-08-05 DIAGNOSIS — F0390 Unspecified dementia without behavioral disturbance: Secondary | ICD-10-CM | POA: Diagnosis present

## 2013-08-05 DIAGNOSIS — I10 Essential (primary) hypertension: Secondary | ICD-10-CM | POA: Diagnosis present

## 2013-08-05 DIAGNOSIS — E876 Hypokalemia: Secondary | ICD-10-CM | POA: Diagnosis present

## 2013-08-05 DIAGNOSIS — R410 Disorientation, unspecified: Secondary | ICD-10-CM | POA: Diagnosis present

## 2013-08-05 DIAGNOSIS — N179 Acute kidney failure, unspecified: Secondary | ICD-10-CM | POA: Diagnosis present

## 2013-08-05 DIAGNOSIS — E860 Dehydration: Secondary | ICD-10-CM | POA: Diagnosis present

## 2013-08-05 DIAGNOSIS — Z9181 History of falling: Secondary | ICD-10-CM

## 2013-08-05 DIAGNOSIS — Z9089 Acquired absence of other organs: Secondary | ICD-10-CM

## 2013-08-05 DIAGNOSIS — W19XXXA Unspecified fall, initial encounter: Secondary | ICD-10-CM

## 2013-08-05 LAB — COMPREHENSIVE METABOLIC PANEL
ALT: 10 U/L (ref 0–35)
ANION GAP: 16 — AB (ref 5–15)
AST: 24 U/L (ref 0–37)
Albumin: 3.5 g/dL (ref 3.5–5.2)
Alkaline Phosphatase: 76 U/L (ref 39–117)
BILIRUBIN TOTAL: 0.4 mg/dL (ref 0.3–1.2)
BUN: 55 mg/dL — AB (ref 6–23)
CHLORIDE: 97 meq/L (ref 96–112)
CO2: 21 mEq/L (ref 19–32)
CREATININE: 1.58 mg/dL — AB (ref 0.50–1.10)
Calcium: 9.7 mg/dL (ref 8.4–10.5)
GFR calc Af Amer: 32 mL/min — ABNORMAL LOW (ref >90)
GFR, EST NON AFRICAN AMERICAN: 28 mL/min — AB (ref >90)
Glucose, Bld: 100 mg/dL — ABNORMAL HIGH (ref 70–99)
POTASSIUM: 4.2 meq/L (ref 3.7–5.3)
Sodium: 134 mEq/L — ABNORMAL LOW (ref 137–147)
Total Protein: 6.8 g/dL (ref 6.0–8.3)

## 2013-08-05 LAB — CBC WITH DIFFERENTIAL/PLATELET
Basophils Absolute: 0 10*3/uL (ref 0.0–0.1)
Basophils Relative: 1 % (ref 0–1)
Eosinophils Absolute: 0.4 10*3/uL (ref 0.0–0.7)
Eosinophils Relative: 5 % (ref 0–5)
HEMATOCRIT: 38.7 % (ref 36.0–46.0)
HEMOGLOBIN: 13.8 g/dL (ref 12.0–15.0)
LYMPHS PCT: 26 % (ref 12–46)
Lymphs Abs: 1.7 10*3/uL (ref 0.7–4.0)
MCH: 31.2 pg (ref 26.0–34.0)
MCHC: 35.7 g/dL (ref 30.0–36.0)
MCV: 87.4 fL (ref 78.0–100.0)
MONO ABS: 0.5 10*3/uL (ref 0.1–1.0)
MONOS PCT: 7 % (ref 3–12)
Neutro Abs: 4.1 10*3/uL (ref 1.7–7.7)
Neutrophils Relative %: 61 % (ref 43–77)
Platelets: 250 10*3/uL (ref 150–400)
RBC: 4.43 MIL/uL (ref 3.87–5.11)
RDW: 13.5 % (ref 11.5–15.5)
WBC: 6.7 10*3/uL (ref 4.0–10.5)

## 2013-08-05 LAB — URINALYSIS, ROUTINE W REFLEX MICROSCOPIC
BILIRUBIN URINE: NEGATIVE
Glucose, UA: NEGATIVE mg/dL
Hgb urine dipstick: NEGATIVE
Ketones, ur: NEGATIVE mg/dL
Leukocytes, UA: NEGATIVE
Nitrite: NEGATIVE
PH: 5 (ref 5.0–8.0)
Protein, ur: NEGATIVE mg/dL
SPECIFIC GRAVITY, URINE: 1.02 (ref 1.005–1.030)
Urobilinogen, UA: 0.2 mg/dL (ref 0.0–1.0)

## 2013-08-05 LAB — LIPASE, BLOOD: Lipase: 30 U/L (ref 11–59)

## 2013-08-05 MED ORDER — METHOCARBAMOL 500 MG PO TABS
500.0000 mg | ORAL_TABLET | Freq: Three times a day (TID) | ORAL | Status: DC | PRN
  Administered 2013-08-06: 500 mg via ORAL
  Filled 2013-08-05: qty 1

## 2013-08-05 MED ORDER — DONEPEZIL HCL 10 MG PO TABS
10.0000 mg | ORAL_TABLET | Freq: Every day | ORAL | Status: DC
  Administered 2013-08-06: 10 mg via ORAL
  Filled 2013-08-05 (×3): qty 1

## 2013-08-05 MED ORDER — ACETAMINOPHEN 500 MG PO TABS
500.0000 mg | ORAL_TABLET | Freq: Four times a day (QID) | ORAL | Status: DC
  Administered 2013-08-05 – 2013-08-06 (×2): 500 mg via ORAL
  Filled 2013-08-05 (×5): qty 1

## 2013-08-05 MED ORDER — SODIUM CHLORIDE 0.9 % IV SOLN
INTRAVENOUS | Status: AC
  Administered 2013-08-05: 17:00:00 via INTRAVENOUS

## 2013-08-05 MED ORDER — POLYETHYLENE GLYCOL 3350 17 G PO PACK
17.0000 g | PACK | Freq: Two times a day (BID) | ORAL | Status: DC
  Administered 2013-08-05 – 2013-08-07 (×4): 17 g via ORAL
  Filled 2013-08-05 (×5): qty 1

## 2013-08-05 MED ORDER — CALCIUM CARBONATE 1250 (500 CA) MG PO TABS
0.5000 | ORAL_TABLET | Freq: Two times a day (BID) | ORAL | Status: DC
Start: 2013-08-06 — End: 2013-08-07
  Administered 2013-08-06 – 2013-08-07 (×3): 250 mg via ORAL
  Filled 2013-08-05 (×6): qty 0.5

## 2013-08-05 MED ORDER — FLEET ENEMA 7-19 GM/118ML RE ENEM
1.0000 | ENEMA | Freq: Once | RECTAL | Status: AC
  Administered 2013-08-05: 1 via RECTAL
  Filled 2013-08-05: qty 1

## 2013-08-05 MED ORDER — MELATONIN 3 MG PO CAPS: 1.0000 | ORAL_CAPSULE | Freq: Every day | ORAL | Status: DC

## 2013-08-05 MED ORDER — HYDROCHLOROTHIAZIDE 25 MG PO TABS
25.0000 mg | ORAL_TABLET | Freq: Every day | ORAL | Status: DC
  Administered 2013-08-06: 25 mg via ORAL
  Filled 2013-08-05: qty 1

## 2013-08-05 MED ORDER — DOCUSATE SODIUM 100 MG PO CAPS
100.0000 mg | ORAL_CAPSULE | Freq: Two times a day (BID) | ORAL | Status: DC
  Administered 2013-08-06 – 2013-08-07 (×3): 100 mg via ORAL
  Filled 2013-08-05 (×5): qty 1

## 2013-08-05 MED ORDER — ONE-DAILY MULTI VITAMINS PO TABS: 1.0000 | ORAL_TABLET | Freq: Every day | ORAL | Status: DC

## 2013-08-05 MED ORDER — POTASSIUM CHLORIDE CRYS ER 20 MEQ PO TBCR
20.0000 meq | EXTENDED_RELEASE_TABLET | Freq: Every day | ORAL | Status: DC
  Administered 2013-08-06 – 2013-08-07 (×2): 20 meq via ORAL
  Filled 2013-08-05 (×2): qty 1

## 2013-08-05 MED ORDER — SODIUM CHLORIDE 0.9 % IV BOLUS (SEPSIS)
1000.0000 mL | Freq: Once | INTRAVENOUS | Status: AC
  Administered 2013-08-05: 1000 mL via INTRAVENOUS

## 2013-08-05 MED ORDER — ONDANSETRON HCL 4 MG/2ML IJ SOLN: 4.0000 mg | Freq: Four times a day (QID) | INTRAMUSCULAR | Status: DC | PRN

## 2013-08-05 MED ORDER — SODIUM CHLORIDE 0.9 % IV SOLN
INTRAVENOUS | Status: AC
  Administered 2013-08-05: 23:00:00 via INTRAVENOUS
  Administered 2013-08-06: 100 mL/h via INTRAVENOUS

## 2013-08-05 MED ORDER — HYDROMORPHONE HCL PF 1 MG/ML IJ SOLN
1.0000 mg | INTRAMUSCULAR | Status: DC | PRN
  Administered 2013-08-05: 1 mg via INTRAVENOUS
  Filled 2013-08-05: qty 1

## 2013-08-05 MED ORDER — ADULT MULTIVITAMIN W/MINERALS CH
1.0000 | ORAL_TABLET | Freq: Every day | ORAL | Status: DC
  Administered 2013-08-06 – 2013-08-07 (×2): 1 via ORAL
  Filled 2013-08-05 (×3): qty 1

## 2013-08-05 MED ORDER — IBUPROFEN 800 MG PO TABS
400.0000 mg | ORAL_TABLET | Freq: Four times a day (QID) | ORAL | Status: DC | PRN
  Administered 2013-08-06 – 2013-08-07 (×2): 400 mg via ORAL
  Filled 2013-08-05 (×2): qty 1

## 2013-08-05 MED ORDER — ASPIRIN 81 MG PO TABS: 81.0000 mg | ORAL_TABLET | Freq: Every day | ORAL | Status: DC

## 2013-08-05 MED ORDER — SENNOSIDES-DOCUSATE SODIUM 8.6-50 MG PO TABS: 1.0000 | ORAL_TABLET | Freq: Two times a day (BID) | ORAL | Status: DC | PRN

## 2013-08-05 MED ORDER — IOHEXOL 300 MG/ML  SOLN
50.0000 mL | Freq: Once | INTRAMUSCULAR | Status: AC | PRN
  Administered 2013-08-05: 50 mL via ORAL

## 2013-08-05 MED ORDER — CALCIUM CARBONATE 200 MG PO CAPS: 200.0000 mg | ORAL_CAPSULE | Freq: Two times a day (BID) | ORAL | Status: DC

## 2013-08-05 MED ORDER — ASPIRIN 81 MG PO CHEW
81.0000 mg | CHEWABLE_TABLET | Freq: Every day | ORAL | Status: DC
  Administered 2013-08-06 – 2013-08-07 (×2): 81 mg via ORAL
  Filled 2013-08-05 (×3): qty 1

## 2013-08-05 MED ORDER — ONDANSETRON HCL 4 MG PO TABS: 4.0000 mg | ORAL_TABLET | Freq: Four times a day (QID) | ORAL | Status: DC | PRN

## 2013-08-05 NOTE — Progress Notes (Signed)
PHARMACIST - PHYSICIAN ORDER COMMUNICATION  CONCERNING: P&T Medication Policy on Herbal Medications  DESCRIPTION:  This patient's order for: Melatonin  has been noted.  This product(s) is classified as an "herbal" or natural product. Due to a lack of definitive safety studies or FDA approval, nonstandard manufacturing practices, plus the potential risk of unknown drug-drug interactions while on inpatient medications, the Pharmacy and Therapeutics Committee does not permit the use of "herbal" or natural products of this type within [facility].   ACTION TAKEN: The pharmacy department is unable to verify this order at this time and your patient has been informed of this safety policy. Please reevaluate patient's clinical condition at discharge and address if the herbal or natural product(s) should be resumed at that time.  Loralee PacasErin Tawn Fitzner, PharmD, BCPS 08/05/2013 6:18 PM
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
## 2013-08-05 NOTE — ED Notes (Signed)
PER EMS - pt from home with c/o lower abd pain, distention, firm per EMS.  Hx "bowel blockages" per family.  Pt lives with son, mild dementia.  Per family, pt was seen here in ED last week with dx "with a small blockage" and constipation, given enema.  Family reports last BM this AM.

## 2013-08-05 NOTE — ED Notes (Signed)
Bed: [facility]ALA Expected date:  Expected time:  Means of arrival:  Comments: EMS-abdominal pain 

## 2013-08-05 NOTE — ED Notes (Signed)
[facility] Pl[facility] Patient on monitor due to HX of AFIB
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
## 2013-08-05 NOTE — ED Notes (Signed)
Initial Contact - pt resting on stretcher, reports c/o "pain all over".  Pt difficult to assess 2/2 dementia, poor historian, no family available at this time.  Abd firm, distended, TTP, lower quadrants.  Pt unsure of last BM or unsure of difficulty with urinating.  No vomiting.  Skin PWD.  MAEI.  Speaking full/clear sentences.  NAD.

## 2013-08-06 DIAGNOSIS — F03918 Unspecified dementia, unspecified severity, with other behavioral disturbance: Secondary | ICD-10-CM | POA: Diagnosis present

## 2013-08-06 DIAGNOSIS — R410 Disorientation, unspecified: Secondary | ICD-10-CM | POA: Diagnosis present

## 2013-08-06 DIAGNOSIS — F0391 Unspecified dementia with behavioral disturbance: Secondary | ICD-10-CM | POA: Diagnosis present

## 2013-08-06 DIAGNOSIS — IMO0002 Reserved for concepts with insufficient information to code with codable children: Secondary | ICD-10-CM

## 2013-08-06 LAB — COMPREHENSIVE METABOLIC PANEL
ALBUMIN: 2.8 g/dL — AB (ref 3.5–5.2)
ALT: 8 U/L (ref 0–35)
AST: 19 U/L (ref 0–37)
Alkaline Phosphatase: 59 U/L (ref 39–117)
Anion gap: 14 (ref 5–15)
BUN: 50 mg/dL — AB (ref 6–23)
CALCIUM: 8.6 mg/dL (ref 8.4–10.5)
CHLORIDE: 104 meq/L (ref 96–112)
CO2: 21 mEq/L (ref 19–32)
CREATININE: 1.3 mg/dL — AB (ref 0.50–1.10)
GFR calc Af Amer: 41 mL/min — ABNORMAL LOW (ref >90)
GFR, EST NON AFRICAN AMERICAN: 35 mL/min — AB (ref >90)
Glucose, Bld: 96 mg/dL (ref 70–99)
Potassium: 3.7 mEq/L (ref 3.7–5.3)
Sodium: 139 mEq/L (ref 137–147)
Total Bilirubin: 0.3 mg/dL (ref 0.3–1.2)
Total Protein: 5.5 g/dL — ABNORMAL LOW (ref 6.0–8.3)

## 2013-08-06 LAB — CBC
HEMATOCRIT: 32.1 % — AB (ref 36.0–46.0)
Hemoglobin: 11.4 g/dL — ABNORMAL LOW (ref 12.0–15.0)
MCH: 30.9 pg (ref 26.0–34.0)
MCHC: 35.5 g/dL (ref 30.0–36.0)
MCV: 87 fL (ref 78.0–100.0)
PLATELETS: 211 10*3/uL (ref 150–400)
RBC: 3.69 MIL/uL — AB (ref 3.87–5.11)
RDW: 13.5 % (ref 11.5–15.5)
WBC: 6.1 10*3/uL (ref 4.0–10.5)

## 2013-08-06 LAB — GLUCOSE, CAPILLARY: Glucose-Capillary: 94 mg/dL (ref 70–99)

## 2013-08-06 MED ORDER — ACETAMINOPHEN 325 MG PO TABS
650.0000 mg | ORAL_TABLET | Freq: Four times a day (QID) | ORAL | Status: DC
  Administered 2013-08-06 – 2013-08-07 (×3): 650 mg via ORAL
  Filled 2013-08-06 (×5): qty 2

## 2013-08-06 MED ORDER — LIDOCAINE 5 % EX PTCH
1.0000 | MEDICATED_PATCH | CUTANEOUS | Status: DC
  Administered 2013-08-06 – 2013-08-07 (×2): 1 via TRANSDERMAL
  Filled 2013-08-06 (×2): qty 1

## 2013-08-06 NOTE — Progress Notes (Signed)
Clinical Social Work Department BRIEF PSYCHOSOCIAL ASSESSMENT 08/06/2013  Patient:  Hayley Long, Hayley Long     Account Number:  [id number]     Admit date:  08/05/2013  Clinical Social Worker:  Earlie Server  Date/Time:  08/06/2013 02:00 PM  Referred by:  Physician  Date Referred:  08/06/2013 Referred for  SNF Placement   Other Referral:   Interview type:  Patient Other interview type:    PSYCHOSOCIAL DATA Living Status:  FAMILY Admitted from facility:   Level of care:   Primary support name:  Doren Custard Primary support relationship to patient:  CHILD, ADULT Degree of support available:   Strong    CURRENT CONCERNS Current Concerns  Post-Acute Placement   Other Concerns:    SOCIAL WORK ASSESSMENT / PLAN CSW received referral in order to assist with DC planning. CSW reviewed chart and met with patient at bedside. Patient confused but dtr at bedside. CSW introduced myself and explained role.    Patient lives at home with son and was recently hospitalized. Son has been caring for patient she has recently been unable to ambulate and son cannot provide care for patient. Patient worked with PT who recommends SNF placement at [facility] spoke with dtr at bedside and son via phone who are both agreeable to SNF placement. CSW provided SNF list and explained insurance approval needed for SNF. Family agreeable to [facility] search and will research options.    CSW spoke with family about ST vs LT placement. Son reports that his plan at this time is for patient to get rehab and return home. CSW provided information for Department of Social Services in case they were interested in applying for Medicaid.    CSW completed FL2 and faxed out. CSW submitted clinicals to [facility] for authorization. CSW will follow up with bed offers.   Assessment/plan status:  Psychosocial Support/Ongoing Assessment of Needs Other assessment/ plan:   Information/referral to community resources:   SNF  information Department of Social Services    PATIENT'S/FAMILY'S RESPONSE TO PLAN OF CARE: Patient unable to participate in assessment. Son and dtr engaged in assessment and thanked CSW for information. Son reports it has been difficult to manage patient at home and he is emotionally drained. Son reports he feels rehab will be beneficial for him and patient. Son is hopeful that patient can return home shortly.       [place], [place] [phone]

## 2013-08-06 NOTE — Progress Notes (Signed)
Patient's son states does not want mother to wear lumbar corset due to distended abdomen.  Patient also states she is not wearing the corset.  Explained reason for corset to provide support with movement and when out of bed.

## 2013-08-06 NOTE — Progress Notes (Signed)
PROGRESS NOTE  Hayley Long ZOX:[id number] DOB: [birth year] DOA: 08/05/2013 PCP: [name],Hayley LORNE, MD  Assessment/Plan: Abdominal pain, unspecified site  - CT scan did not show SBO but there was significant stool burden  - miralax added to previous home BM regimen (colace and senna)  - limit IV pain meds as causing delerium - regular diet if pt tolerates   Hallucinations -prob delerium from narcotics- avoid -family at bedside -patient high risk as dementia at baseline  ARF (acute renal failure) / Dehydration  - secondary to dehydration  - continue IV fluids  - follow up BMP in am   Coronary artery disease  - continue aspirin daily   Dementia -at baseline  Hypertension  - restart home med   Lumbar compression fracture  - lumbar brace per NS, may need vertebroplasty if pain does not improves  -PT eval- may need SNF -lidocaine patch  Hypokalemia  - pt on daily potassium supplementation   Distended bladder  - insert foley cath  - monitor urine output    Code Status: full Family Communication: patient and son Disposition Plan: ?SNF need   Consultants:  PT  Procedures:      HPI/Subjective: No CP, no SOB Insisting that a dog is in her bathroom, and seeing a man in her room  Objective: Filed Vitals:   08/06/13 0515  BP: 114/64  Pulse: 83  Temp: 98.3 F (36.8 C)  Resp: 18    Intake/Output Summary (Last 24 hours) at 08/06/13 0950 Last data filed at 08/06/13 0736  Gross per 24 hour  Intake   1335 ml  Output   2000 ml  Net   -665 ml   Filed Weights   08/05/13 2248 08/06/13 0515  Weight: 59.7 kg (131 lb 9.8 oz) 57.8 kg (127 lb 6.8 oz)    Exam:   General:  Confused- not oriented to time at baseline  Cardiovascular: rrr  Respiratory: clear  Abdomen: +BS, soft  Musculoskeletal: moves all 4 ext   Data Reviewed: Basic Metabolic Panel:  Recent Labs Lab 07/31/13 1110 08/01/13 0450 08/05/13 1307 08/06/13 0520  NA 134* 139 134*  139  K 2.7* 3.0* 4.2 3.7  CL 98 100 97 104  CO2  --  25 21 21   GLUCOSE 100* 99 100* 96  BUN 21 28* 55* 50*  CREATININE 0.80 0.80 1.58* 1.30*  CALCIUM  --  9.2 9.7 8.6   Liver Function Tests:  Recent Labs Lab 08/05/13 1307 08/06/13 0520  AST 24 19  ALT 10 8  ALKPHOS 76 59  BILITOT 0.4 0.3  PROT 6.8 5.5*  ALBUMIN 3.5 2.8*    Recent Labs Lab 08/05/13 1307  LIPASE 30   No results found for this basename: AMMONIA,  in the last 168 hours CBC:  Recent Labs Lab 07/31/13 1058 07/31/13 1110 08/05/13 1307 08/06/13 0520  WBC 5.7  --  6.7 6.1  NEUTROABS 3.2  --  4.1  --   HGB 14.8 16.0* 13.8 11.4*  HCT 41.1 47.0* 38.7 32.1*  MCV 84.2  --  87.4 87.0  PLT 292  --  250 211   Cardiac Enzymes: No results found for this basename: CKTOTAL, CKMB, CKMBINDEX, TROPONINI,  in the last 168 hours BNP (last 3 results) No results found for this basename: PROBNP,  in the last 8760 hours CBG:  Recent Labs Lab 08/06/13 0521  GLUCAP 94    No results found for this or any previous visit (from the past 240 hour(s)).  Studies: Ct Abdomen Pelvis Wo Contrast  08/05/2013   CLINICAL DATA:  Lower abdominal pain, distention.  EXAM: CT ABDOMEN AND PELVIS WITHOUT CONTRAST  TECHNIQUE: Multidetector CT imaging of the abdomen and pelvis was performed following the standard protocol without IV contrast.  COMPARISON:  Plain films 07/31/2013.  CT 07/19/2011  FINDINGS: Linear dependent atelectasis in the lung bases. No effusions. Heart is borderline in size.  Prior cholecystectomy. Liver, pancreas, adrenals have an unremarkable unenhanced appearance. Calcifications throughout the spleen compatible with old granulomatous disease.  Bilateral renal cysts again noted, stable. There is mild fullness of the renal collecting system and ureters bilaterally compatible with mild hydronephrosis. The urinary bladder is markedly distended to the level of the umbilicus.  Large stool burden in the rectosigmoid colon.  Cannot exclude fecal impaction. Nonobstructive bowel gas pattern. No free fluid, free air or adenopathy.  Aorta, iliacs and branch vessels are heavily calcified.  Advanced degenerative disc disease changes throughout the lumbar spine. There is a comminuted fracture through the L3 vertebral body, similar to prior plain films. Compression fractures through the inferior endplate of L2 and superior endplate of L4. No retropulsed fracture fragments.  IMPRESSION: Severely distended urinary bladder.  Mild bilateral hydronephrosis.  Comminuted fracture through the L3 vertebral body, similar to prior plain films. Compression fractures at L2 and L4.  Large stool burden in the rectosigmoid colon. Cannot exclude fecal impaction.   Electronically Signed   By: Charlett NoseKevin  Dover M.D.   On: 08/05/2013 15:43    Scheduled Meds: . acetaminophen  500 mg Oral Q6H  . aspirin  81 mg Oral Daily  . calcium carbonate  0.5 tablet Oral BID WC  . docusate sodium  100 mg Oral BID  . donepezil  10 mg Oral QHS  . hydrochlorothiazide  25 mg Oral Daily  . multivitamin with minerals  1 tablet Oral Daily  . polyethylene glycol  17 g Oral BID  . potassium chloride SA  20 mEq Oral Daily   Continuous Infusions: . sodium chloride 100 mL/hr (08/06/13 0750)   Antibiotics Given (last 72 hours)   None      Principal Problem:   Abdominal pain, unspecified site Active Problems:   Coronary artery disease   Hypertension   Lumbar compression fracture   Hypokalemia   ARF (acute renal failure)   Fall   Dehydration    Time spent: 35 min    Crecencio Kwiatek  [facility] Pager [phone]. If 7PM-7AM, please contact night-coverage at [email or web address], password [email or web address] 08/06/2013, 9:50 AM  LOS: 1 day

## 2013-08-06 NOTE — Evaluation (Signed)
Physical Therapy Evaluation Patient Details Name: Rosie FateDorothy P Snowden MRN: [id number] DOB: August 20, 1920 Today's Date: 08/06/2013   History of Present Illness  80 yo female admitted with abdominal pain. CT showed comminuted fracture through the L3 vertebral body, similar to prior films. Compression fractures of L2, L4 as well. Hx of A fib, HTN, CAD, dementia.   Clinical Impression  On eval, pt required Max assist for mobility-able to perform stand pivot with RW, bed>recliner. Mobility significantly limited by pain. Max encouragement for pt to mobilize as much as possible.     Follow Up Recommendations SNF    Equipment Recommendations  None recommended by PT    Recommendations for Other Services OT consult     Precautions / Restrictions Precautions Precautions: Fall Required Braces or Orthoses: Spinal Brace Spinal Brace: Lumbar corset;Applied in sitting position Restrictions Weight Bearing Restrictions: No      Mobility  Bed Mobility Overal bed mobility: Needs Assistance Bed Mobility: Rolling;Sidelying to Sit Rolling: Mod assist Sidelying to sit: Max assist       General bed mobility comments: assist for trunk to upright. Increased time. Max encouragement for continued participation.   Transfers Overall transfer level: Needs assistance Equipment used: Rolling walker (2 wheeled) Transfers: Sit to/from [employer]GI CorporationStand;Stand Pivot Transfers Sit to Stand: Max assist;From elevated surface Stand pivot transfers: Mod assist       General transfer comment: Assist to rise, stabilize, control descent. VCS safety, technique, hand placement. 2 attempts to get to full standing posture. Max encouragement for continued participation. Stand pivot from bed to recliner using walker.   Ambulation/Gait             General Gait Details: Deferred for safety reasons-+2 not available  Stairs            Wheelchair Mobility    Modified Rankin (Stroke Patients Only)       Balance  Overall balance assessment: Needs assistance Sitting-balance support: Bilateral upper extremity supported;Feet supported Sitting balance-Leahy Scale: Fair     Standing balance support: Bilateral upper extremity supported;During functional activity Standing balance-Leahy Scale: Poor                               Pertinent Vitals/Pain Back, bil LEs-pt unable to rate numerically but does endorse pain, location    Home Living Family/patient expects to be discharged to:: Private residence Living Arrangements: Children Available Help at Discharge: Family Type of Home: House Home Access: Stairs to enter   Secretary/administratorntrance Stairs-Number of Steps: 1 Home Layout: One level Home Equipment: Environmental consultantWalker - 2 wheels;Cane - single point      Prior Function Level of Independence: Independent with assistive device(s)         Comments: uses walker.      Hand Dominance        Extremity/Trunk Assessment   Upper Extremity Assessment: Generalized weakness           Lower Extremity Assessment: Generalized weakness;RLE deficits/detail;LLE deficits/detail RLE Deficits / Details: pt c/o pain in LEs LLE Deficits / Details: pt c/o pain in LEs  Cervical / Trunk Assessment: Kyphotic  Communication   Communication: HOH  Cognition Arousal/Alertness: Awake/alert Behavior During Therapy: WFL for tasks assessed/performed Overall Cognitive Status: History of cognitive impairments - at baseline                      General Comments      Exercises  Assessment/Plan    PT Assessment Patient needs continued PT services  PT Diagnosis Difficulty walking;Generalized weakness;Acute pain   PT Problem List Decreased strength;Decreased mobility;Pain;Decreased cognition;Decreased knowledge of use of DME;Decreased balance;Decreased activity tolerance;Decreased range of motion  PT Treatment Interventions DME instruction;Gait training;Functional mobility training;Therapeutic  activities;Therapeutic exercise;Patient/family education;Balance training   PT Goals (Current goals can be found in the Care Plan section) Acute Rehab PT Goals Patient Stated Goal: rehab per daughter PT Goal Formulation: With family Time For Goal Achievement: 08/20/13 Potential to Achieve Goals: Fair    Frequency Min 3X/week   Barriers to discharge        Co-evaluation               End of Session Equipment Utilized During Treatment: Gait belt Activity Tolerance: Patient limited by fatigue;Patient limited by pain Patient left: in chair;with call bell/phone within reach;with family/visitor present           Time: [id number] PT Time Calculation (min): 25 min   Charges:   PT Evaluation $Initial PT Evaluation Tier I: 1 Procedure PT Treatments $Therapeutic Activity: 23-37 mins   PT G Codes:          Rebeca AlertJannie Prathik Aman, MPT Pager: [phone]

## 2013-08-06 NOTE — Progress Notes (Addendum)
Clinical Social Work Department CLINICAL SOCIAL WORK PLACEMENT NOTE 08/06/2013  Patient:  Hayley Long,Hayley Long  Account Number:  [id number] Admit date:  08/05/2013  Clinical Social Worker:  Unk LightningHOLLY Yovana Scogin, LCSW  Date/time:  08/06/2013 02:00 PM  Clinical Social Work is seeking post-discharge placement for this patient at the following level of care:   SKILLED NURSING   (*CSW will update this form in Epic as items are completed)   08/06/2013  [facility]family provided with [facility] of Clinical [facility] Work's list of facilities offering this level of care within the geographic area requested by the patient (or if unable, by the patient's family).  08/06/2013  Patient/family informed of their freedom to choose among providers that offer the needed level of care, that participate in Medicare, Medicaid or managed care program needed by the patient, have an available bed and are willing to accept the patient.  08/06/2013  Patient/family informed of [facility]' ownership interest in [facility], as well as of the fact that they are under no obligation to receive care at this facility.  PASARR submitted to EDS on existing # PASARR number received on   FL2 transmitted to all facilities in geographic area requested by pt/family on  08/06/2013 FL2 transmitted to all facilities within larger geographic area on   Patient informed that his/her managed care company has contracts with or will negotiate with  certain facilities, including the following:     Patient/family informed of bed offers received:  08[facility]07/13 [facility] and patient [facility] bed at    [facility]  [facility]13 Patient to be transferred to facility by [facility] Patient and family notified of transfer on 08/07/13 Name of family member notified:  Philip-son  The following physician request were entered in Epic:   Additional Comments:

## 2013-08-06 NOTE — Care Management Note (Signed)
    Page 1 of 1   08/06/2013     2:56:27 PM CARE MANAGEMENT NOTE 08/06/2013  Patient:  Hayley Long,Hayley Long   Account Number:  [id number]  Date Initiated:  08/06/2013  Documentation initiated by:  [facility]Tamotsu Wiederholt  Subjective/Objective Assessment:   80 year old female admitted with ARF.     Action/Plan:   From home, needs SNF at d/c.   Anticipated DC Date:  08/09/2013   Anticipated DC Plan:  SKILLED NURSING FACILITY  In-house referral  Clinical Social Worker      DC Planning Services  CM consult      Choice offered to / List presented to:             Status of service:  Completed, signed off Medicare Important Message given?   (If response is "NO", the following Medicare IM given date fields will be blank) Date Medicare IM given:   Medicare IM given by:   Date Additional Medicare IM given:   Additional Medicare IM given by:    Discharge Disposition:    Per UR Regulation:  Reviewed for med. necessity/level of care/duration of stay  If discussed at Long Length of Stay Meetings, dates discussed:    Comments:

## 2013-08-06 NOTE — Progress Notes (Signed)
Patient sitting in recliner dozing and chatting with daughter.  No complaints at this time

## 2013-08-06 NOTE — Progress Notes (Signed)
[facility] passed flatus but no bowel movement, tolerated getting out of bed without problems except pain.  Two people assist

## 2013-08-07 DIAGNOSIS — K5900 Constipation, unspecified: Secondary | ICD-10-CM | POA: Diagnosis present

## 2013-08-07 DIAGNOSIS — N3289 Other specified disorders of bladder: Secondary | ICD-10-CM | POA: Diagnosis not present

## 2013-08-07 LAB — BASIC METABOLIC PANEL
Anion gap: 11 (ref 5–15)
BUN: 34 mg/dL — ABNORMAL HIGH (ref 6–23)
CO2: 24 meq/L (ref 19–32)
Calcium: 9.2 mg/dL (ref 8.4–10.5)
Chloride: 105 mEq/L (ref 96–112)
Creatinine, Ser: 0.95 mg/dL (ref 0.50–1.10)
GFR calc non Af Amer: 51 mL/min — ABNORMAL LOW (ref >90)
GFR, EST AFRICAN AMERICAN: 59 mL/min — AB (ref >90)
Glucose, Bld: 105 mg/dL — ABNORMAL HIGH (ref 70–99)
POTASSIUM: 3.2 meq/L — AB (ref 3.7–5.3)
Sodium: 140 mEq/L (ref 137–147)

## 2013-08-07 LAB — CBC
HCT: 34.4 % — ABNORMAL LOW (ref 36.0–46.0)
Hemoglobin: 11.9 g/dL — ABNORMAL LOW (ref 12.0–15.0)
MCH: 30.4 pg (ref 26.0–34.0)
MCHC: 34.6 g/dL (ref 30.0–36.0)
MCV: 87.8 fL (ref 78.0–100.0)
Platelets: 203 10*3/uL (ref 150–400)
RBC: 3.92 MIL/uL (ref 3.87–5.11)
RDW: 13.5 % (ref 11.5–15.5)
WBC: 7.6 10*3/uL (ref 4.0–10.5)

## 2013-08-07 LAB — GLUCOSE, CAPILLARY: GLUCOSE-CAPILLARY: 116 mg/dL — AB (ref 70–99)

## 2013-08-07 MED ORDER — ACETAMINOPHEN 500 MG PO TABS: 500.0000 mg | ORAL_TABLET | ORAL | Status: AC | PRN

## 2013-08-07 MED ORDER — METHOCARBAMOL 500 MG PO TABS: 500.0000 mg | ORAL_TABLET | Freq: Four times a day (QID) | ORAL | Status: AC | PRN

## 2013-08-07 MED ORDER — LIDOCAINE 5 % EX PTCH: 1.0000 | MEDICATED_PATCH | CUTANEOUS | Status: AC

## 2013-08-07 MED ORDER — AMLODIPINE BESYLATE 2.5 MG PO TABS: 2.5000 mg | ORAL_TABLET | Freq: Every day | ORAL | Status: AC

## 2013-08-07 MED ORDER — POLYETHYLENE GLYCOL 3350 17 G PO PACK: 17.0000 g | PACK | Freq: Two times a day (BID) | ORAL | Status: DC

## 2013-08-07 NOTE — Progress Notes (Signed)
Clinical Social Work  [facility] has [facility] and SNF is agreeable to accept patient today. [facility] who reports insurance has approved # [id number]. CSW informed patient and MD of DC plans and will assist with transfer.  MeridianHolly Myrle Wanek, [place]CS[place] [phone]

## 2013-08-07 NOTE — Progress Notes (Signed)
Discharge instructions accompanied pt, left the unit in stable condition to SNF via ambulance.

## 2013-08-07 NOTE — Discharge Summary (Signed)
Physician Discharge Summary  Hayley Long WUJ:[id number] DOB: 12-Jul-1920 DOA: 08/05/2013  PCP: [name],[name], MD  Admit date: 08/05/2013 Discharge date: 08/07/2013  Time spent: 35 minutes  Recommendations for Outpatient Follow-up:  #1 Discharge to skilled nursing facility #2 monitor BMET in 1 week. #3. Please attempt voiding trial in 2-3 days #4. Please strictly avoid narcotics as it makes her extremely delirous   Discharge Diagnoses:  Principal Problem:   Abdominal pain, unspecified site  Active Problems:   Lumbar compression fracture   Coronary artery disease   Hypertension   Hypokalemia   ARF (acute renal failure)   Fall   Dehydration   Dementia with behavioral disturbance   Delirium   Unspecified constipation   Bladder distended   Discharge Condition: Fair  Diet recommendation: Regular   Filed Weights   08/05/13 2248 08/06/13 0515 08/07/13 0521  Weight: 59.7 kg (131 lb 9.8 oz) 57.8 kg (127 lb 6.8 oz) 59.5 kg (131 lb 2.8 oz)    History of present illness:  PCP for to admission H&P for details, but in brief, 80 year old female with history of hypertension, dementia, CAD, A. fib not on Coumadin due to noncompliance, recent hospitalization for fall with lumbar compression fracture and discharged home with home health returned a few days later [facility] on 08/05/2013, 3 abdominal pain for one day. She also was found to be delirious. In the ED her vitals were stable. Given abdominal pain and distention CT scan of the abdomen and pelvis was done which showed severely distended needed and in the bladder with mild bilateral hydronephrosis. Also showed a large stool burden in the rectosigmoid colon. Also noted for a comminuted fracture through the L3 vertebral body seen at the prior x-ray along with compression fracture of L2 and L4. Patient admitted to medical floor.[name].   Hospital Course:  Abdominal pain Likely secondary to severe constipation. Patient is on  Colace and senna at home which is continued and added MiraLax. She has had good bowel movements in the hospital. Patient received a dose of Dilantin in the ED and has made her very delirious. Strongly  recommended to avoid narcotics.   Acute kidney injury Secondary to dehydration and patient also on frequent ibuprofen for pain. HCTZ held and given IV fluids. Now resolved with hydration.  Delirium with hallucinations Has underlying dementia and  symptoms worsened with dehydration and narcotic received in the ED. Now at baseline.  Lumbar compression fracture Lumbar brace applied as per neurosurgery recommendation. We'll increase frequency of Tylenol to 500 mg every 4 hours and also increased frequency of Robaxin 500 mg every 6 hours as needed. Lidocaine patch to the lumbar area every 12 hours. Avoid NSAIDs given the acute kidney injury.  As per neurosurgery if symptoms do not improve she may need vertebroplasty. Seen by physical therapy as some unable to take care of her at home and recommended skilled nursing facility. Patient has been able to get out of bed to chair with assist.   Bladder distention Unclear cause. Seen on CT scan on admission with mild bilateral hydronephrosis. UA unremarkable. Foley  Catheter placed  on admission which can be continued until patient has better mobility. Please attempt voiding trial in next 2-3 days.  Hypokalemia Replenished. Continue home potassium  Coronary artery disease Continue daily aspirin. Patient has chronic total occlusion of LAD, not a candidate for PCI. She was offered surgery many years ago but refused. . Follows with Dr. Melburn PopperNasher.    Hypertension Hold  HCTZ given  acute kidney injury. Added low dose amlodipine.  Dementia Continue Aricept  Procedures:  None  Consultations:  Neurosurgery consulted over the phone  Discharge Exam: Filed Vitals:   08/07/13 0521  BP: 151/91  Pulse: 97  Temp: 97.4 F (36.3 C)  Resp: 22    General:  Elderly female in no acute distress HEENT: No pallor, moist oral mucosa Chest: Clear to auscultation bilaterally,  CVS: Normal S1 and S2,  Abdomen: Soft, nondistended, bowel sounds present, lumbar brace applied, has Foley catheter Extremities: Warm, no edema CNS: Alert and awake   Discharge Instructions You were cared for by a hospitalist during your hospital stay. If you have any questions about your discharge medications or the care you received while you were in the hospital after you are discharged, you can call the unit and asked to speak with the hospitalist on call if the hospitalist that took care of you is not available. Once you are discharged, your primary care physician will handle any further medical issues. Please note that NO REFILLS for any discharge medications will be authorized once you are discharged, as it is imperative that you return to your primary care physician (or establish a relationship with a primary care physician if you do not have one) for your aftercare needs so that they can reassess your need for medications and monitor your lab values.     Medication List    STOP taking these medications       hydrochlorothiazide 25 MG tablet  Commonly known as:  HYDRODIURIL     ibuprofen 200 MG tablet  Commonly known as:  ADVIL,MOTRIN      TAKE these medications       acetaminophen 500 MG tablet  Commonly known as:  TYLENOL  Take 1 tablet (500 mg total) by mouth every 4 (four) hours as needed.     amLODipine 2.5 MG tablet  Commonly known as:  NORVASC  Take 1 tablet (2.5 mg total) by mouth daily.     aspirin 81 MG tablet  Take 81 mg by mouth daily.     calcium carbonate 200 MG capsule  Take 200 mg by mouth 2 (two) times daily with a meal.     docusate sodium 100 MG capsule  Commonly known as:  COLACE  Take 100 mg by mouth 2 (two) times daily.     donepezil 10 MG tablet  Commonly known as:  ARICEPT  Take 10 mg by mouth at bedtime.     lidocaine 5 %   Commonly known as:  LIDODERM  Place 1 patch onto the skin daily. Remove & Discard patch within 12 hours or as directed by MD     Melatonin 3 MG Caps  Take 1 capsule by mouth at bedtime.     methocarbamol 500 MG tablet  Commonly known as:  ROBAXIN  Take 1 tablet (500 mg total) by mouth every 6 (six) hours as needed for muscle spasms.     multivitamin tablet  Take 1 tablet by mouth daily.     nitroGLYCERIN 0.4 MG SL tablet  Commonly known as:  NITROSTAT  Place 0.4 mg under the tongue every 5 (five) minutes as needed. Chest pain     OSTEO BI-FLEX JOINT SHIELD PO  Take 1 tablet by mouth 2 (two) times daily.     OVER THE COUNTER MEDICATION  Take 1 tablet by mouth daily. NATTOKIASE-HERBAL SUPPLEMENT TO PREVENT ARTERY  BLOCKAGES     polyethylene glycol  packet  Commonly known as:  MIRALAX / GLYCOLAX  Take 17 g by mouth 2 (two) times daily.     potassium chloride SA 20 MEQ tablet  Commonly known as:  K-DUR,KLOR-CON  Take 20 mEq by mouth daily.     senna-docusate 8.6-50 MG per tablet  Commonly known as:  Senokot-S  Take 1 tablet by mouth 2 (two) times daily as needed for mild constipation.       Allergies  Allergen Reactions  . Corticosteroids Swelling  . Tramadol Other (See Comments)    Confusion & violent reactions       Follow-up Information   Follow up with Mickie Hillier, MD. Schedule an appointment as soon as possible for a visit in 1 week. (after discharge from SNF)    Specialty:  Family Medicine   Contact information:   [address] [place] [address] [phone]        The results of significant diagnostics from this hospitalization (including imaging, microbiology, ancillary and laboratory) are listed below for reference.    Significant Diagnostic Studies: Ct Abdomen Pelvis Wo Contrast  08/05/2013   CLINICAL DATA:  Lower abdominal pain, distention.  EXAM: CT ABDOMEN AND PELVIS WITHOUT CONTRAST  TECHNIQUE: Multidetector CT imaging of the  abdomen and pelvis was performed following the standard protocol without IV contrast.  COMPARISON:  Plain films 07/31/2013.  CT 07/19/2011  FINDINGS: Linear dependent atelectasis in the lung bases. No effusions. Heart is borderline in size.  Prior cholecystectomy. Liver, pancreas, adrenals have an unremarkable unenhanced appearance. Calcifications throughout the spleen compatible with old granulomatous disease.  Bilateral renal cysts again noted, stable. There is mild fullness of the renal collecting system and ureters bilaterally compatible with mild hydronephrosis. The urinary bladder is markedly distended to the level of the umbilicus.  Large stool burden in the rectosigmoid colon. Cannot exclude fecal impaction. Nonobstructive bowel gas pattern. No free fluid, free air or adenopathy.  Aorta, iliacs and branch vessels are heavily calcified.  Advanced degenerative disc disease changes throughout the lumbar spine. There is a comminuted fracture through the L3 vertebral body, similar to prior plain films. Compression fractures through the inferior endplate of L2 and superior endplate of L4. No retropulsed fracture fragments.  IMPRESSION: Severely distended urinary bladder.  Mild bilateral hydronephrosis.  Comminuted fracture through the L3 vertebral body, similar to prior plain films. Compression fractures at L2 and L4.  Large stool burden in the rectosigmoid colon. Cannot exclude fecal impaction.   Electronically Signed   By: Charlett Nose M.D.   On: 08/05/2013 15:43   Dg Lumbar Spine Complete  07/31/2013   CLINICAL DATA:  Low back and lower extremity radicular symptoms status post fall 2 weeks ago  EXAM: LUMBAR SPINE - COMPLETE 4+ VIEW  COMPARISON:  An abdominal series of July 23, 2011  FINDINGS: There is stable partial compression of the body of L3. A prominent Schmorl's node of the superior endplate contributes to the appearance. There is new compression of the body of L3 along the mid portions of the superior  and inferior endplates. There is new abnormal inferior endplate depression at L2. There is no spondylolisthesis. There is stable disc space narrowing at L1-2 and at L4-5 and at L5-S1. The transverse processes are poorly visualized. The observed portions of the sacrum are normal.  IMPRESSION: 1. There is new compression of the superior and inferior endplates of the body of L3. The overall loss of height of the vertebral body is approximately 10%. 2.  There is new inferior endplate depression of L2 possibly related to a prominent Schmorl node. 3. There are chronic degenerative changes of the L1-2, L4-5, and L5-S1 discs. There is chronic superior endplate depression of L4.   Electronically Signed   By: David  [place]   On: 07/31/2013 10:56   Dg Sacrum/coccyx  07/31/2013   CLINICAL DATA:  Status post fall 2 weeks ago with pain radiating to bilateral lower extremity  EXAM: SACRUM AND COCCYX - 2+ VIEW  COMPARISON:  None.  FINDINGS: There is no acute fracture or dislocation. Chronic changes of the distal sacrum are noted.  IMPRESSION: No acute fracture or dislocation.  Chronic changes of distal sacrum.   Electronically Signed   By: Sherian Rein M.D.   On: 07/31/2013 10:59    Microbiology: No results found for this or any previous visit (from the past 240 hour(s)).   Labs: Basic Metabolic Panel:  Recent Labs Lab 08/01/13 0450 08/05/13 1307 08/06/13 0520 08/07/13 0513  NA 139 134* 139 140  K 3.0* 4.2 3.7 3.2*  CL 100 97 104 105  CO2 25 21 21 24   GLUCOSE 99 100* 96 105*  BUN 28* 55* 50* 34*  CREATININE 0.80 1.58* 1.30* 0.95  CALCIUM 9.2 9.7 8.6 9.2   Liver Function Tests:  Recent Labs Lab 08/05/13 1307 08/06/13 0520  AST 24 19  ALT 10 8  ALKPHOS 76 59  BILITOT 0.4 0.3  PROT 6.8 5.5*  ALBUMIN 3.5 2.8*    Recent Labs Lab 08/05/13 1307  LIPASE 30   No results found for this basename: AMMONIA,  in the last 168 hours CBC:  Recent Labs Lab 08/05/13 1307 08/06/13 0520 08/07/13 0513   WBC 6.7 6.1 7.6  NEUTROABS 4.1  --   --   HGB 13.8 11.4* 11.9*  HCT 38.7 32.1* 34.4*  MCV 87.4 87.0 87.8  PLT 250 211 203   Cardiac Enzymes: No results found for this basename: CKTOTAL, CKMB, CKMBINDEX, TROPONINI,  in the last 168 hours BNP: BNP (last 3 results) No results found for this basename: PROBNP,  in the last 8760 hours CBG:  Recent Labs Lab 08/06/13 0521 08/07/13 0705  GLUCAP 94 116*       Signed:  Deke Tilghman  [facility] 08/07/2013, 12:11 PM

## 2013-08-07 NOTE — Progress Notes (Signed)
Clinical Social Work  CSW met with patient, son, and dtr at bedside. Son reports that patient is doing better today but he still feels that SNF is needed. CSW provided bed offers and explained DC plans. CSW encouraged family to review offers and make a choice because DC cannot be delayed due to SNF choice. Son reports he has good reviews from [facility] and plans to go and tour facility. CSW will continue to follow and assist as needed.  [place], [place] [phone]

## 2013-08-07 NOTE — Progress Notes (Signed)
Clinical Social Work  CSW faxed DC summary to [facility] who is agreeable to accept patient today. CSW prepared DC packet with FL2, DC summary, and hard scripts included. CSW informed patient, son, dtr, MD, and RN of plans and all parties agreeable. Family is happy that patient can go to their first choice SNF and feel it would be a good fit for patient. Family prefers [facility] to provide transportation. [facility] request #: [id number].  CSW is signing off but available if needed.  [place], [place] [phone]

## 2013-08-13 ENCOUNTER — Inpatient Hospital Stay (HOSPITAL_COMMUNITY)
Admission: EM | Admit: 2013-08-13 | Discharge: 2013-08-15 | DRG: 871 | Disposition: A | Discharge status: Hospice | Payer: Medicare Other | Attending: Internal Medicine | Admitting: Internal Medicine

## 2013-08-13 ENCOUNTER — Encounter (HOSPITAL_COMMUNITY): Payer: Self-pay | Admitting: Emergency Medicine

## 2013-08-13 ENCOUNTER — Emergency Department (HOSPITAL_COMMUNITY): Payer: Medicare Other

## 2013-08-13 DIAGNOSIS — A419 Sepsis, unspecified organism: Secondary | ICD-10-CM | POA: Diagnosis present

## 2013-08-13 DIAGNOSIS — F0280 Dementia in other diseases classified elsewhere without behavioral disturbance: Secondary | ICD-10-CM | POA: Diagnosis present

## 2013-08-13 DIAGNOSIS — E860 Dehydration: Secondary | ICD-10-CM

## 2013-08-13 DIAGNOSIS — G309 Alzheimer's disease, unspecified: Secondary | ICD-10-CM | POA: Diagnosis present

## 2013-08-13 DIAGNOSIS — Z9089 Acquired absence of other organs: Secondary | ICD-10-CM | POA: Diagnosis not present

## 2013-08-13 DIAGNOSIS — R627 Adult failure to thrive: Secondary | ICD-10-CM | POA: Diagnosis present

## 2013-08-13 DIAGNOSIS — I4891 Unspecified atrial fibrillation: Secondary | ICD-10-CM | POA: Diagnosis present

## 2013-08-13 DIAGNOSIS — R339 Retention of urine, unspecified: Secondary | ICD-10-CM | POA: Diagnosis present

## 2013-08-13 DIAGNOSIS — Z9119 Patient's noncompliance with other medical treatment and regimen: Secondary | ICD-10-CM

## 2013-08-13 DIAGNOSIS — Z66 Do not resuscitate: Secondary | ICD-10-CM | POA: Diagnosis present

## 2013-08-13 DIAGNOSIS — I2510 Atherosclerotic heart disease of native coronary artery without angina pectoris: Secondary | ICD-10-CM | POA: Diagnosis present

## 2013-08-13 DIAGNOSIS — IMO0002 Reserved for concepts with insufficient information to code with codable children: Secondary | ICD-10-CM | POA: Diagnosis not present

## 2013-08-13 DIAGNOSIS — F03918 Unspecified dementia, unspecified severity, with other behavioral disturbance: Secondary | ICD-10-CM

## 2013-08-13 DIAGNOSIS — Z8249 Family history of ischemic heart disease and other diseases of the circulatory system: Secondary | ICD-10-CM | POA: Diagnosis not present

## 2013-08-13 DIAGNOSIS — R338 Other retention of urine: Secondary | ICD-10-CM | POA: Diagnosis present

## 2013-08-13 DIAGNOSIS — G9340 Encephalopathy, unspecified: Secondary | ICD-10-CM | POA: Diagnosis present

## 2013-08-13 DIAGNOSIS — Z9181 History of falling: Secondary | ICD-10-CM | POA: Diagnosis not present

## 2013-08-13 DIAGNOSIS — Z79899 Other long term (current) drug therapy: Secondary | ICD-10-CM | POA: Diagnosis not present

## 2013-08-13 DIAGNOSIS — Z7982 Long term (current) use of aspirin: Secondary | ICD-10-CM

## 2013-08-13 DIAGNOSIS — N390 Urinary tract infection, site not specified: Secondary | ICD-10-CM | POA: Diagnosis present

## 2013-08-13 DIAGNOSIS — M129 Arthropathy, unspecified: Secondary | ICD-10-CM | POA: Diagnosis present

## 2013-08-13 DIAGNOSIS — I10 Essential (primary) hypertension: Secondary | ICD-10-CM | POA: Diagnosis present

## 2013-08-13 DIAGNOSIS — Z91199 Patient's noncompliance with other medical treatment and regimen due to unspecified reason: Secondary | ICD-10-CM

## 2013-08-13 DIAGNOSIS — E876 Hypokalemia: Secondary | ICD-10-CM | POA: Diagnosis present

## 2013-08-13 DIAGNOSIS — Z833 Family history of diabetes mellitus: Secondary | ICD-10-CM | POA: Diagnosis not present

## 2013-08-13 DIAGNOSIS — F0390 Unspecified dementia without behavioral disturbance: Secondary | ICD-10-CM | POA: Diagnosis present

## 2013-08-13 DIAGNOSIS — R4182 Altered mental status, unspecified: Secondary | ICD-10-CM | POA: Diagnosis not present

## 2013-08-13 DIAGNOSIS — N3289 Other specified disorders of bladder: Secondary | ICD-10-CM

## 2013-08-13 DIAGNOSIS — K5900 Constipation, unspecified: Secondary | ICD-10-CM | POA: Diagnosis present

## 2013-08-13 DIAGNOSIS — F0391 Unspecified dementia with behavioral disturbance: Secondary | ICD-10-CM

## 2013-08-13 DIAGNOSIS — S32000A Wedge compression fracture of unspecified lumbar vertebra, initial encounter for closed fracture: Secondary | ICD-10-CM

## 2013-08-13 LAB — CBC WITH DIFFERENTIAL/PLATELET
Basophils Absolute: 0 10*3/uL (ref 0.0–0.1)
Basophils Relative: 0 % (ref 0–1)
EOS ABS: 0 10*3/uL (ref 0.0–0.7)
Eosinophils Relative: 0 % (ref 0–5)
HEMATOCRIT: 37.2 % (ref 36.0–46.0)
HEMOGLOBIN: 13 g/dL (ref 12.0–15.0)
LYMPHS ABS: 0.6 10*3/uL — AB (ref 0.7–4.0)
Lymphocytes Relative: 4 % — ABNORMAL LOW (ref 12–46)
MCH: 30.5 pg (ref 26.0–34.0)
MCHC: 34.9 g/dL (ref 30.0–36.0)
MCV: 87.3 fL (ref 78.0–100.0)
MONO ABS: 1 10*3/uL (ref 0.1–1.0)
MONOS PCT: 7 % (ref 3–12)
NEUTROS PCT: 89 % — AB (ref 43–77)
Neutro Abs: 13.1 10*3/uL — ABNORMAL HIGH (ref 1.7–7.7)
Platelets: 270 10*3/uL (ref 150–400)
RBC: 4.26 MIL/uL (ref 3.87–5.11)
RDW: 13.2 % (ref 11.5–15.5)
WBC: 14.8 10*3/uL — ABNORMAL HIGH (ref 4.0–10.5)

## 2013-08-13 LAB — I-STAT CG4 LACTIC ACID, ED: LACTIC ACID, VENOUS: 1.63 mmol/L (ref 0.5–2.2)

## 2013-08-13 LAB — URINALYSIS, ROUTINE W REFLEX MICROSCOPIC
Bilirubin Urine: NEGATIVE
Glucose, UA: NEGATIVE mg/dL
Ketones, ur: NEGATIVE mg/dL
Nitrite: NEGATIVE
PROTEIN: 30 mg/dL — AB
Specific Gravity, Urine: 1.017 (ref 1.005–1.030)
Urobilinogen, UA: 1 mg/dL (ref 0.0–1.0)
pH: 8.5 — ABNORMAL HIGH (ref 5.0–8.0)

## 2013-08-13 LAB — CBC
HEMATOCRIT: 37.2 % (ref 36.0–46.0)
HEMOGLOBIN: 13 g/dL (ref 12.0–15.0)
MCH: 30.4 pg (ref 26.0–34.0)
MCHC: 34.9 g/dL (ref 30.0–36.0)
MCV: 87.1 fL (ref 78.0–100.0)
Platelets: 256 10*3/uL (ref 150–400)
RBC: 4.27 MIL/uL (ref 3.87–5.11)
RDW: 13.1 % (ref 11.5–15.5)
WBC: 14.6 10*3/uL — ABNORMAL HIGH (ref 4.0–10.5)

## 2013-08-13 LAB — COMPREHENSIVE METABOLIC PANEL
ALT: 28 U/L (ref 0–35)
AST: 32 U/L (ref 0–37)
Albumin: 2.9 g/dL — ABNORMAL LOW (ref 3.5–5.2)
Alkaline Phosphatase: 71 U/L (ref 39–117)
Anion gap: 19 — ABNORMAL HIGH (ref 5–15)
BUN: 40 mg/dL — ABNORMAL HIGH (ref 6–23)
CO2: 21 meq/L (ref 19–32)
CREATININE: 0.81 mg/dL (ref 0.50–1.10)
Calcium: 9.7 mg/dL (ref 8.4–10.5)
Chloride: 97 mEq/L (ref 96–112)
GFR, EST AFRICAN AMERICAN: 72 mL/min — AB (ref >90)
GFR, EST NON AFRICAN AMERICAN: 62 mL/min — AB (ref >90)
Glucose, Bld: 115 mg/dL — ABNORMAL HIGH (ref 70–99)
Potassium: 3.5 mEq/L — ABNORMAL LOW (ref 3.7–5.3)
Sodium: 137 mEq/L (ref 137–147)
Total Bilirubin: 1 mg/dL (ref 0.3–1.2)
Total Protein: 6.5 g/dL (ref 6.0–8.3)

## 2013-08-13 LAB — URINE MICROSCOPIC-ADD ON

## 2013-08-13 LAB — TROPONIN I: Troponin I: <0.3 ng/mL (ref <0.30)

## 2013-08-13 LAB — PRO B NATRIURETIC PEPTIDE: Pro B Natriuretic peptide (BNP): 2628 pg/mL — ABNORMAL HIGH (ref 0–450)

## 2013-08-13 LAB — CREATININE, SERUM
CREATININE: 0.74 mg/dL (ref 0.50–1.10)
GFR calc Af Amer: 84 mL/min — ABNORMAL LOW (ref >90)
GFR calc non Af Amer: 73 mL/min — ABNORMAL LOW (ref >90)

## 2013-08-13 MED ORDER — ALUM & MAG HYDROXIDE-SIMETH 200-200-20 MG/5ML PO SUSP: 30.0000 mL | Freq: Four times a day (QID) | ORAL | Status: DC | PRN

## 2013-08-13 MED ORDER — SENNOSIDES-DOCUSATE SODIUM 8.6-50 MG PO TABS: 1.0000 | ORAL_TABLET | Freq: Two times a day (BID) | ORAL | Status: DC | PRN

## 2013-08-13 MED ORDER — DEXTROSE 5 % IV SOLN
1.0000 g | INTRAVENOUS | Status: DC
  Administered 2013-08-13: 1 g via INTRAVENOUS
  Filled 2013-08-13: qty 10

## 2013-08-13 MED ORDER — SODIUM CHLORIDE 0.9 % IJ SOLN: 3.0000 mL | Freq: Two times a day (BID) | INTRAMUSCULAR | Status: DC

## 2013-08-13 MED ORDER — DILTIAZEM HCL 100 MG IV SOLR
5.0000 mg/h | Freq: Once | INTRAVENOUS | Status: AC
  Administered 2013-08-13: 5 mg/h via INTRAVENOUS
  Filled 2013-08-13: qty 100

## 2013-08-13 MED ORDER — SODIUM CHLORIDE 0.9 % IV SOLN
INTRAVENOUS | Status: DC
  Administered 2013-08-13: 09:00:00 via INTRAVENOUS

## 2013-08-13 MED ORDER — SODIUM CHLORIDE 0.9 % IV BOLUS (SEPSIS)
500.0000 mL | Freq: Once | INTRAVENOUS | Status: AC
  Administered 2013-08-13: 500 mL via INTRAVENOUS

## 2013-08-13 MED ORDER — DEXTROSE 5 % IV SOLN
1.0000 g | INTRAVENOUS | Status: DC
  Administered 2013-08-14 – 2013-08-15 (×2): 1 g via INTRAVENOUS
  Filled 2013-08-13 (×2): qty 10

## 2013-08-13 MED ORDER — ENOXAPARIN SODIUM 40 MG/0.4ML ~~LOC~~ SOLN
40.0000 mg | SUBCUTANEOUS | Status: DC
  Administered 2013-08-13 – 2013-08-14 (×2): 40 mg via SUBCUTANEOUS
  Filled 2013-08-13 (×3): qty 0.4

## 2013-08-13 MED ORDER — TAMSULOSIN HCL 0.4 MG PO CAPS
0.4000 mg | ORAL_CAPSULE | Freq: Every day | ORAL | Status: DC
  Administered 2013-08-14: 0.4 mg via ORAL
  Filled 2013-08-13 (×3): qty 1

## 2013-08-13 MED ORDER — BETHANECHOL CHLORIDE 10 MG PO TABS
10.0000 mg | ORAL_TABLET | Freq: Two times a day (BID) | ORAL | Status: DC
  Administered 2013-08-13 – 2013-08-15 (×5): 10 mg via ORAL
  Filled 2013-08-13 (×6): qty 1

## 2013-08-13 MED ORDER — POTASSIUM CHLORIDE CRYS ER 20 MEQ PO TBCR
20.0000 meq | EXTENDED_RELEASE_TABLET | Freq: Every day | ORAL | Status: DC
  Administered 2013-08-13 – 2013-08-15 (×3): 20 meq via ORAL
  Filled 2013-08-13 (×3): qty 1

## 2013-08-13 MED ORDER — DONEPEZIL HCL 10 MG PO TABS
10.0000 mg | ORAL_TABLET | Freq: Every day | ORAL | Status: DC
  Administered 2013-08-13 – 2013-08-14 (×2): 10 mg via ORAL
  Filled 2013-08-13 (×3): qty 1

## 2013-08-13 MED ORDER — ONDANSETRON HCL 4 MG/2ML IJ SOLN: 4.0000 mg | Freq: Four times a day (QID) | INTRAMUSCULAR | Status: DC | PRN

## 2013-08-13 MED ORDER — SODIUM CHLORIDE 0.9 % IV SOLN
INTRAVENOUS | Status: DC
Start: 2013-08-13 — End: 2013-08-15
  Administered 2013-08-13 – 2013-08-14 (×3): via INTRAVENOUS

## 2013-08-13 MED ORDER — METHOCARBAMOL 500 MG PO TABS
500.0000 mg | ORAL_TABLET | Freq: Three times a day (TID) | ORAL | Status: DC | PRN
  Filled 2013-08-13: qty 1

## 2013-08-13 MED ORDER — ASPIRIN 81 MG PO CHEW
81.0000 mg | CHEWABLE_TABLET | Freq: Every day | ORAL | Status: DC
  Administered 2013-08-13 – 2013-08-15 (×3): 81 mg via ORAL
  Filled 2013-08-13 (×3): qty 1

## 2013-08-13 MED ORDER — ONDANSETRON HCL 4 MG PO TABS: 4.0000 mg | ORAL_TABLET | Freq: Four times a day (QID) | ORAL | Status: DC | PRN

## 2013-08-13 MED ORDER — DOCUSATE SODIUM 100 MG PO CAPS
100.0000 mg | ORAL_CAPSULE | Freq: Every day | ORAL | Status: DC
  Administered 2013-08-13 – 2013-08-15 (×3): 100 mg via ORAL
  Filled 2013-08-13 (×3): qty 1

## 2013-08-13 MED ORDER — ACETAMINOPHEN 325 MG PO TABS
650.0000 mg | ORAL_TABLET | Freq: Four times a day (QID) | ORAL | Status: DC | PRN
  Administered 2013-08-14 – 2013-08-15 (×2): 650 mg via ORAL
  Filled 2013-08-13 (×2): qty 2

## 2013-08-13 MED ORDER — POLYETHYLENE GLYCOL 3350 17 G PO PACK
17.0000 g | PACK | Freq: Every day | ORAL | Status: DC
  Administered 2013-08-13: 17 g via ORAL
  Filled 2013-08-13 (×3): qty 1
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
## 2013-08-13 NOTE — ED Notes (Signed)
MD at bedside. 
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
## 2013-08-13 NOTE — ED Notes (Addendum)
Per EMS patient fractured vertebra a few weeks ago, has had poor appetite, been unable to void, had distended abdomen relived after foley catheter was placed this morning to drain bladder.

## 2013-08-13 NOTE — ED Notes (Signed)
Bed: [id number] Expected date:  Expected time:  Means of arrival:  Comments: EMS 80 yr old from rehab, urinary retention

## 2013-08-13 NOTE — ED Notes (Signed)
Patient transported to X-ray 

## 2013-08-14 DIAGNOSIS — R339 Retention of urine, unspecified: Secondary | ICD-10-CM

## 2013-08-14 LAB — BASIC METABOLIC PANEL
ANION GAP: 12 (ref 5–15)
BUN: 40 mg/dL — ABNORMAL HIGH (ref 6–23)
CALCIUM: 8.7 mg/dL (ref 8.4–10.5)
CO2: 23 mEq/L (ref 19–32)
Chloride: 108 mEq/L (ref 96–112)
Creatinine, Ser: 0.79 mg/dL (ref 0.50–1.10)
GFR calc Af Amer: 82 mL/min — ABNORMAL LOW (ref >90)
GFR, EST NON AFRICAN AMERICAN: 71 mL/min — AB (ref >90)
Glucose, Bld: 89 mg/dL (ref 70–99)
Potassium: 3.4 mEq/L — ABNORMAL LOW (ref 3.7–5.3)
SODIUM: 143 meq/L (ref 137–147)

## 2013-08-14 LAB — CBC
HCT: 33.1 % — ABNORMAL LOW (ref 36.0–46.0)
Hemoglobin: 11.6 g/dL — ABNORMAL LOW (ref 12.0–15.0)
MCH: 30.8 pg (ref 26.0–34.0)
MCHC: 35 g/dL (ref 30.0–36.0)
MCV: 87.8 fL (ref 78.0–100.0)
Platelets: 254 10*3/uL (ref 150–400)
RBC: 3.77 MIL/uL — ABNORMAL LOW (ref 3.87–5.11)
RDW: 13.3 % (ref 11.5–15.5)
WBC: 9.9 10*3/uL (ref 4.0–10.5)

## 2013-08-14 LAB — TSH: TSH: 3.87 u[IU]/mL (ref 0.350–4.500)

## 2013-08-14 MED ORDER — ENSURE COMPLETE PO LIQD: 237.0000 mL | Freq: Two times a day (BID) | ORAL | Status: DC | PRN

## 2013-08-14 NOTE — Progress Notes (Signed)
Thank you for consulting the Palliative Medicine Team at [facility] to meet your patient's and family's needs.   The reason that you asked us to see your patient is  For GOC possible hospice care  We have scheduled your patient for a meeting: 7/17 at 830 am   The Surrogate decision make is: Koren Shiverhillip Vink Contact information: [phone]  Other family members that need to be present: NA    Your patient is able/unable to participate: unable  Additional Narrative:    Taneisha Fuson L. Ladona Ridgelaylor, MD MBA The Palliative Medicine Team at [facility] Team Phone: [phone] Pager: [phone]

## 2013-08-14 NOTE — Progress Notes (Signed)
Patient ZO:XWRUEAV:Hayley Long      DOB: [birth year]      WUJ:[id number]  Visited with Torre who was sleeping but slowly rousable to verbal and tactile stimuli.  She initially stated she was ok .  I asked if I could listen on her heart , she stated ok but when I placed the stethoscope down she started to swinging and stated "Get her out of here".  No family at the bedside.     Left message for son to call our phone.  Nura Cahoon L. Ladona Ridgelaylor, MD MBA The Palliative Medicine Team at [facility] Team Phone: [phone] Pager: [phone]'

## 2013-08-14 NOTE — Progress Notes (Signed)
[facility] PROGRESS NOTE  Hayley Long ZOX:[id number] DOB: May 21, 1920 DOA: 08/13/2013 PCP: [name],[name], MD  Assessment/Plan: Sepsis due to UTI The patient presented with sepsis with leukocytosis, new onset rapid A. fib and acute encephalopathy. Likely precipitated by UTI. Started on empiric IV Rocephin and IV fluids. Patient reportedly has very poor by mouth intake and having urinary retention in the skilled nursing facility. Follow urine culture. Patient afebrile and leukocytosis resolved. Heart rate stable. Continue IV hydration  Failure to thrive with progressive deconditioning Patient clinically improving but still appears very weak with poor by mouth intake. Patient having frequent hospitalization O.' weeks. PT eval and nutrition consult. -Family is not interested in sending her back to skilled nursing facility and would like to take her home. Discussed goals of care and they are inclined towards making her as comfortable as possible and only on having discussion with palliative care and possible home hospice if appropriate. Consult placed.   Urinary retention Patient having urinary retention since previous hospitalization and discharged with Foley catheter. She without the Foley catheter in the skilled nursing facility and having urinary retention since then. An indwelling catheter placed on admission with good urine output. Placed on daily Flomax. Continue Foley catheter for now with plan on voiding trial in a few weeks as outpatient.  Hypokalemia replenish  Hypertension and A. fib Currently in sinus rhythm. Possibly triggered by hypertension and sepsis. No history of A. fib . Check TSH . Continue when necessary Cardizem  Recent lumbar compression fracture Continue Tylenol and when necessary Robaxin  Constipation Continue senna and Colace twice a day when necessary  Dementia Continue Aricept  DVT prophylaxis: Subcutaneous Lovenox Diet: Regular  Code  Status: DO NOT RESUSCITATE Family Communication: Son  And daughter at bedside Disposition Plan: Pending palliative care discussion focals of care   Consultants:  Palliative care  Procedures:  None  Antibiotics:  IV Rocephin  HPI/Subjective: Patient seen and examined this morning. She appears confused and very weak  Objective: Filed Vitals:   08/14/13 0512  BP: 153/96  Pulse: 91  Temp: 98.2 F (36.8 C)  Resp: 16    Intake/Output Summary (Last 24 hours) at 08/14/13 1251 Last data filed at 08/14/13 1207  Gross per 24 hour  Intake 1432.5 ml  Output   2775 ml  Net -1342.5 ml   Filed Weights   08/14/13 0512  Weight: 57 kg (125 lb 10.6 oz)    Exam:   General:  Elderly female lying in bed appears fatigued  HEENT: No pallor, moist oral mucosa  Cardiovascular: Normal S1-S2, no murmurs  Respiratory: Clear to auscultation bilaterally  Abdomen: , Nontender, nondistended, bowel sounds present, Foley in place  Musculoskeletal: Warm, no edema  CNS: AAO X0  Data Reviewed: Basic Metabolic Panel:  Recent Labs Lab 08/13/13 0848 08/13/13 1430 08/14/13 0447  NA 137  --  143  K 3.5*  --  3.4*  CL 97  --  108  CO2 21  --  23  GLUCOSE 115*  --  89  BUN 40*  --  40*  CREATININE 0.81 0.74 0.79  CALCIUM 9.7  --  8.7   Liver Function Tests:  Recent Labs Lab 08/13/13 0848  AST 32  ALT 28  ALKPHOS 71  BILITOT 1.0  PROT 6.5  ALBUMIN 2.9*   No results found for this basename: LIPASE, AMYLASE,  in the last 168 hours No results found for this basename: AMMONIA,  in the last 168 hours CBC:  Recent Labs Lab 08/13/13 0846 08/13/13 1430 08/14/13 0447  WBC 14.8* 14.6* 9.9  NEUTROABS 13.1*  --   --   HGB 13.0 13.0 11.6*  HCT 37.2 37.2 33.1*  MCV 87.3 87.1 87.8  PLT 270 256 254   Cardiac Enzymes:  Recent Labs Lab 08/13/13 0846  TROPONINI <0.30   BNP (last 3 results)  Recent Labs  08/13/13 0846  PROBNP 2628.0*   CBG: No results found for  this basename: GLUCAP,  in the last 168 hours  Recent Results (from the past 240 hour(s))  URINE CULTURE     Status: None   Collection Time    08/13/13  8:27 AM      Result Value Ref Range Status   Specimen Description URINE, CATHETERIZED   Final   Special Requests NONE   Final   Culture  Setup Time     Final   Value: 08/13/2013 11:05     Performed at [facility] Count PENDING   Incomplete   Culture     Final   Value: Culture reincubated for better growth     Performed at [facility]   Report Status PENDING   Incomplete  CULTURE, BLOOD (ROUTINE X 2)     Status: None   Collection Time    08/13/13  2:05 PM      Result Value Ref Range Status   Specimen Description BLOOD LEFT ARM   Final   Special Requests BOTTLES DRAWN AEROBIC ONLY 6CC   Final   Culture  Setup Time     Final   Value: 08/13/2013 18:48     Performed at [facility]   Culture     Final   Value:        BLOOD CULTURE RECEIVED NO GROWTH TO DATE CULTURE WILL BE HELD FOR 5 DAYS BEFORE ISSUING A FINAL NEGATIVE REPORT     Performed at [facility]   Report Status PENDING   Incomplete  CULTURE, BLOOD (ROUTINE X 2)     Status: None   Collection Time    08/13/13  2:25 PM      Result Value Ref Range Status   Specimen Description BLOOD LEFT ARM   Final   Special Requests BOTTLES DRAWN AEROBIC AND ANAEROBIC 4CC   Final   Culture  Setup Time     Final   Value: 08/13/2013 18:48     Performed at [facility]   Culture     Final   Value:        BLOOD CULTURE RECEIVED NO GROWTH TO DATE CULTURE WILL BE HELD FOR 5 DAYS BEFORE ISSUING A FINAL NEGATIVE REPORT     Performed at [facility]   Report Status PENDING   Incomplete     Studies: Dg Chest 2 View  08/13/2013   CLINICAL DATA:  Recent vertebral fracture an urinary retention ; decreased appetite  EXAM: CHEST  2 VIEW  COMPARISON:  CT scan of the abdomen and pelvis dated August 05, 2013  FINDINGS: The lungs are adequately  inflated. There is a small amount of pleural fluid layering posteriorly on the lateral film. There are coarse infrahilar lung markings on the right. The heart is mildly enlarged. The pulmonary vascularity is not engorged. The thoracic vertebral bodies are preserved in height.  IMPRESSION: Pneumonia in right lower lobe posteriorly. Tiny bilateral pleural effusions.   Electronically Signed   By: David  [place]   On:  08/13/2013 10:33    Scheduled Meds: . aspirin  81 mg Oral Daily  . bethanechol  10 mg Oral BID  . cefTRIAXone (ROCEPHIN)  IV  1 g Intravenous Q24H  . docusate sodium  100 mg Oral Daily  . donepezil  10 mg Oral QHS  . enoxaparin (LOVENOX) injection  40 mg Subcutaneous Q24H  . polyethylene glycol  17 g Oral Daily  . potassium chloride SA  20 mEq Oral Daily  . sodium chloride  3 mL Intravenous Q12H  . tamsulosin  0.4 mg Oral QPC supper   Continuous Infusions: . sodium chloride 75 mL/hr at 08/13/13 1954      Time spent: 25 minutes    Patrisha Hausmann  [facility] Pager [phone]. If 7PM-7AM, please contact night-coverage at [email or web address], password [email or web address] 08/14/2013, 12:51 PM  LOS: 1 day
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
## 2013-08-14 NOTE — Progress Notes (Signed)
PT Cancellation Note  Patient Details Name: Hayley Long MRN: [id number] DOB: 1920/06/29   Cancelled Treatment:    Reason Eval/Treat Not Completed: Other (comment) (Palliative care consult Pending. Will await  goals of care. will follow up 08/15/13)   Hayley Long, Hayley Long 08/14/2013, 1:27 PM Hayley Long PT [phone]

## 2013-08-15 ENCOUNTER — Other Ambulatory Visit: Payer: Self-pay | Admitting: Internal Medicine

## 2013-08-15 DIAGNOSIS — F0390 Unspecified dementia without behavioral disturbance: Secondary | ICD-10-CM | POA: Diagnosis present

## 2013-08-15 DIAGNOSIS — R338 Other retention of urine: Secondary | ICD-10-CM | POA: Diagnosis present

## 2013-08-15 LAB — BASIC METABOLIC PANEL
Anion gap: 13 (ref 5–15)
BUN: 32 mg/dL — AB (ref 6–23)
CALCIUM: 8.8 mg/dL (ref 8.4–10.5)
CO2: 22 mEq/L (ref 19–32)
Chloride: 105 mEq/L (ref 96–112)
Creatinine, Ser: 0.6 mg/dL (ref 0.50–1.10)
GFR, EST NON AFRICAN AMERICAN: 78 mL/min — AB (ref >90)
GLUCOSE: 104 mg/dL — AB (ref 70–99)
Potassium: 3.2 mEq/L — ABNORMAL LOW (ref 3.7–5.3)
SODIUM: 140 meq/L (ref 137–147)

## 2013-08-15 LAB — URINE CULTURE: Colony Count: >=100000

## 2013-08-15 MED ORDER — ENSURE COMPLETE PO LIQD: 237.0000 mL | Freq: Two times a day (BID) | ORAL | Status: AC | PRN

## 2013-08-15 MED ORDER — CIPROFLOXACIN HCL 250 MG PO TABS: 500.0000 mg | ORAL_TABLET | Freq: Two times a day (BID) | ORAL | Status: AC

## 2013-08-15 MED ORDER — TAMSULOSIN HCL 0.4 MG PO CAPS: 0.4000 mg | ORAL_CAPSULE | Freq: Every day | ORAL | Status: AC

## 2013-08-15 MED ORDER — POTASSIUM CHLORIDE CRYS ER 20 MEQ PO TBCR
40.0000 meq | EXTENDED_RELEASE_TABLET | Freq: Once | ORAL | Status: AC
  Administered 2013-08-15: 40 meq via ORAL
  Filled 2013-08-15: qty 2

## 2013-08-15 NOTE — Progress Notes (Addendum)
Patient CN:Hayley Long      DOB: [birth year]      ZMO:[id number]  Met with patient's son and subsequently daughter.  Patient more awake and pleasant this morning.  Patient's son just met with [facility] Rep.  He feels that they have what they need to make the next transition.  He wants to take his mom home with hospice care.  I provided a MOST form to review as he and his sister are attempting to solidify future goals as she will be a risk for urinary tract infections and dehydration with worsening malnutrition if she does not eat.  I believe that her dementia is playing a significant role in her chronic returns to hospital and if she continues to have  issues with nutrition and infection she could be consider as having a 6 months or less prognosis.   Will update Dr. Rex Kras.   Densil Ottey L. Lovena Le, MD MBA The Palliative Medicine Team at [facility] Phone: [phone] Pager: [phone]

## 2013-08-15 NOTE — Progress Notes (Signed)
Physical Therapy Treatment Patient Details Name: Hayley Long MRN: [id number] DOB: Oct 10, 1920 Today's Date: 08/15/2013    History of Present Illness 80 yo female admitted with AMS, sepsis. Recent history of CT showing comminuted fracture through the L3 vertebral body, similar to prior films. Compression fractures of L2, L4 as well. Hx of A fib, HTN, CAD, dementia.      PT Comments    2nd session to assist pt back to bed at family's request.   Follow Up Recommendations  Supervision/Assistance - 24 hour;Home health PT (d/c home with family with hospice)     Equipment Recommendations  Hospital bed    Recommendations for Other Services OT consult     Precautions / Restrictions Precautions Precautions: Fall Required Braces or Orthoses: Spinal Brace Spinal Brace: Lumbar corset;Applied in sitting position Restrictions Weight Bearing Restrictions: No    Mobility  Bed Mobility Overal bed mobility: Needs Assistance Bed Mobility: Sit to Supine Rolling: Mod assist Sidelying to sit: Mod assist;+2 for physical assistance;+2 for safety/equipment   Sit to supine: Mod assist;+2 for physical assistance;+2 for safety/equipment   General bed mobility comments: Assist for trunk and bil LEs. Increased time. Attempted logroll however pt did not follow cues.   Transfers Overall transfer level: Needs assistance Equipment used: Rolling walker (2 wheeled) Transfers: Sit to/from Stand Sit to Stand: Max assist;+2 physical assistance;+2 safety/equipment Stand pivot transfers: Max assist;+2 physical assistance;+2 safety/equipment       General transfer comment: Assist to rise, stabilize, control descent. VCS safety, technique, hand placement. Max encouragement for continued participation. Stand pivot from recliner to bed using walker. Pt unsafe with transfer-sat abruptly before safely positioned in front of bed  Ambulation/Gait                 Stairs            Wheelchair  Mobility    Modified Rankin (Stroke Patients Only)       Balance                                    Cognition Arousal/Alertness: Awake/alert Behavior During Therapy: Anxious Overall Cognitive Status: History of cognitive impairments - at baseline                      Exercises      General Comments        Pertinent Vitals/Pain Back-unrated    Home Living Family/patient expects to be discharged to:: Private residence Living Arrangements: Children Available Help at Discharge: Family Type of Home: House Home Access: Stairs to enter Entrance Stairs-Rails: None Home Layout: One level Home Equipment: Environmental consultantWalker - 2 wheels;Cane - single point      Prior Function Level of Independence: Needs assistance  Gait / Transfers Assistance Needed: Max assist for transfers on last admission ~1 week ago       PT Goals (current goals can now be found in the care plan section) Acute Rehab PT Goals Patient Stated Goal: home with family/hospice per daughter PT Goal Formulation: With family Time For Goal Achievement: 08/22/13 Potential to Achieve Goals: Fair Progress towards PT goals: Progressing toward goals    Frequency  Min 3X/week    PT Plan Current plan remains appropriate    Co-evaluation             End of Session   Activity Tolerance: Patient limited by fatigue;Patient limited by pain  Patient left: in bed;with call bell/phone within reach;with family/visitor present     Time: 1610-9604 PT Time Calculation (min): 17 min  Charges:  $Therapeutic Activity: 8-22 mins                    G Codes:      Rebeca Alert, MPT Pager: [phone]

## 2013-08-15 NOTE — Progress Notes (Signed)
CSW consulted for transportation needs. Patient will need non-emergency ambulance transport home. CSW confirmed home address with patient's daughter at bedside. RN, Marchelle Folksmanda to call for transport (ph#: [phone]) once hospital bed has been delivered to house and setup. Transport packet in [place].   No other CSW needs identified - CSW signing off.   Lincoln MaxinKelly Mayeli Bornhorst, LCSW [facility] Clinical Social Worker cell #: [phone]

## 2013-08-15 NOTE — Progress Notes (Signed)
[facility] called for transport pickup.      Yuvonne Lanahan, LCSW [facility] Clinical Social Worker cell #: [phone]  

## 2013-08-15 NOTE — Discharge Summary (Signed)
Physician Discharge Summary  Hayley Long GNF:[id number] DOB: 1920/11/02 DOA: 08/13/2013  PCP: Mickie Hillier, MD  Admit date: 08/13/2013 Discharge date: 08/15/2013  Time spent: 40 minutes  Recommendations for Outpatient Follow-up:  1. Discharge home with home hospice 2. Recommend Hospice nurse to evaluate fully catheter and attempt voiding trials in the next 2-3 weeks  Discharge Diagnoses:  Principal Problem:   Sepsis secondary to UTI  Active Problems:   Lumbar compression fracture   UTI (urinary tract infection)   Urinary retention   Hypokalemia   Unspecified constipation   Atrial fibrillation with RVR   FTT (failure to thrive) in adult   Acute encephalopathy   A-fib   Dementia, possibly Alzheimer's   Discharge Condition: Fair  Diet recommendation: Regular with supplements  Filed Weights   08/14/13 0512  Weight: 57 kg (125 lb 10.6 oz)    History of present illness:  Please refer to admission H&P for details , but in brief, 80 y.o. female with a past medical history of dementia, likely Alzheimer's, A. fib not on Coumadin due to noncompliance, hypertension, with a recent L1 compression fracture medically managed, recently admitted to the medicine service on 08/05/2013 discharged on 08/07/2013, at which time she was hospitalized for abdominal pain and distention, found to have a severely distended urinary bladder with associated mild bilateral hydronephrosis. Patient was discharged in stable condition to skilled nursing facility with indwelling Foley catheter. As per history provided by Her son ,  she pulled her Foley catheter out at a skilled nursing facility, after which she developed significant abdominal distention and pain.. In and out caths were then performed draining 1 L of urine at one time. Patient has been having a gradual functional decline with associated poor mouth intake and becoming increasing lethargic and confused. He was unaware of patient having  fevers or chills. Initial lab work done in the emergency room showed leukocytosis with WBC of 14.6 and urinary tract infection for which she was started on IV ceftriaxone. She was also found to be in atrial fibrillation with rapid ventricular response having ventricular rates as high as 140. She was started on a Cardizem drip. Given leukocytosis , increased heart rate and encephalopathy patient admitted to telemetry for sepsis likely due to UTI.   Hospital Course:  Sepsis due to UTI  The patient presented with sepsis with leukocytosis, rapid A. fib and acute encephalopathy. Likely precipitated by UTI. Started on empiric IV Rocephin and IV fluids. Culture is growing mixed bacteria. Previously had Velna Hatchet UTI. Patient reportedly has very poor by mouth intake and having urinary retention in the skilled nursing facility.  Follow urine culture. Patient afebrile and leukocytosis resolved. Heart rate stable. -Will discharge her home on oral ciprofloxacin to complete a seven-day course.  Failure to thrive with progressive deconditioning  Patient clinically improved since admission with stable vitals but still pretty weak and deconditioned.  Patient requiring frequent hospitalizations for different issues over past few weeks. PT eval and nutrition consulted.  -Patient son and daughter who are her health care proxy not interested in sending her back to skilled nursing facility and would like to take her home. Discussed goals of care and they are inclined towards making her as comfortable as possible and wished to have discussion with palliative care and possible home hospice if appropriate.  Seen by palliative care and plan to discharge her home on home hospice. Family interested in minimal intervention. She is a DO NOT RESUSCITATE and they do not wish for  repeat hospitalization unless appropriate. She will be followed by hospice nurse at home.  Urinary retention  Patient having urinary retention since previous  hospitalization and discharged with Foley catheter.  Foley catheter removed in the skilled nursing facility and having urinary retention since then. An indwelling catheter placed on admission with good urine output. She has been started on daily Flomax. She will be discharged home on Foley for at least 2-3 weeks and hospice nurse will attempt a trial of voiding at home after that. Continue Flomax upon discharge.  Hypokalemia  Replenished   Hypertension and A. fib  Currently in sinus rhythm. Possibly triggered by hypertension and sepsis. Blood pressure stable. Patient has history of A. fib not on anticoagulation due to noncompliance.  Recent lumbar compression fracture  Continue Tylenol and when necessary Robaxin   Constipation  Continue daily MiraLax and senna and Colace twice a day when necessary   Moderate Dementia  Possibly  Alzheimer's dementia. Continue Aricept   Diet: Regular with supplements  Code Status: DO NOT RESUSCITATE  Family Communication: Son And daughter at bedside  Disposition Plan: Home with home hospice  Consultants:  Palliative care   Procedures:  None   Antibiotics:  IV Rocephin 7/15-/17 Discharge home on oral ciprofloxacin     Discharge Exam: Filed Vitals:   08/15/13 0616  BP: 144/79  Pulse: 104  Temp: 98.1 F (36.7 C)  Resp: 18   General: Elderly female lying in bed appears fatigued  HEENT: No pallor, moist oral mucosa  Cardiovascular: Normal S1-S2, no murmurs  Respiratory: Clear to auscultation bilaterally  Abdomen: , Nontender, nondistended, bowel sounds present, Foley in place  Musculoskeletal: Warm, no edema  CNS: AAO X1    Discharge Instructions You were cared for by a hospitalist during your hospital stay. If you have any questions about your discharge medications or the care you received while you were in the hospital after you are discharged, you can call the unit and asked to speak with the hospitalist on call if the  hospitalist that took care of you is not available. Once you are discharged, your primary care physician will handle any further medical issues. Please note that NO REFILLS for any discharge medications will be authorized once you are discharged, as it is imperative that you return to your primary care physician (or establish a relationship with a primary care physician if you do not have one) for your aftercare needs so that they can reassess your need for medications and monitor your lab values.     Medication List    STOP taking these medications       bethanechol 10 MG tablet  Commonly known as:  URECHOLINE     docusate sodium 100 MG capsule  Commonly known as:  COLACE      TAKE these medications       acetaminophen 500 MG tablet  Commonly known as:  TYLENOL  Take 1 tablet (500 mg total) by mouth every 4 (four) hours as needed.     amLODipine 2.5 MG tablet  Commonly known as:  NORVASC  Take 1 tablet (2.5 mg total) by mouth daily.     aspirin 81 MG tablet  Take 81 mg by mouth daily.     calcium carbonate 200 MG capsule  Take 200 mg by mouth 2 (two) times daily with a meal.     ciprofloxacin 250 MG tablet  Commonly known as:  CIPRO  Take 2 tablets (500 mg total) by mouth 2 (  two) times daily. Until 08/19/2013     donepezil 10 MG tablet  Commonly known as:  ARICEPT  Take 10 mg by mouth at bedtime.     feeding supplement (ENSURE COMPLETE) Liqd  Take 237 mLs by mouth 2 (two) times daily between meals as needed (Allow per pt or pt's family request).     lidocaine 5 %  Commonly known as:  LIDODERM  Place 1 patch onto the skin daily. Remove & Discard patch within 12 hours or as directed by MD     Melatonin 3 MG Caps  Take 1 capsule by mouth at bedtime.     methocarbamol 500 MG tablet  Commonly known as:  ROBAXIN  Take 1 tablet (500 mg total) by mouth every 6 (six) hours as needed for muscle spasms.     multivitamin tablet  Take 1 tablet by mouth daily.      nitroGLYCERIN 0.4 MG SL tablet  Commonly known as:  NITROSTAT  Place 0.4 mg under the tongue every 5 (five) minutes as needed. Chest pain     polyethylene glycol packet  Commonly known as:  MIRALAX / GLYCOLAX  Take 17 g by mouth daily.     potassium chloride SA 20 MEQ tablet  Commonly known as:  K-DUR,KLOR-CON  Take 20 mEq by mouth daily.     senna-docusate 8.6-50 MG per tablet  Commonly known as:  Senokot-S  Take 1 tablet by mouth 2 (two) times daily as needed for mild constipation.     tamsulosin 0.4 MG Caps capsule  Commonly known as:  FLOMAX  Take 1 capsule (0.4 mg total) by mouth daily after supper.       Allergies  Allergen Reactions  . Corticosteroids Swelling  . Other     Narcotics make her "crazy"  . Tramadol Other (See Comments)    Confusion & violent reactions       Follow-up Information   Please follow up. (follow up with home hospice)        The results of significant diagnostics from this hospitalization (including imaging, microbiology, ancillary and laboratory) are listed below for reference.    Significant Diagnostic Studies: Ct Abdomen Pelvis Wo Contrast  08/05/2013   CLINICAL DATA:  Lower abdominal pain, distention.  EXAM: CT ABDOMEN AND PELVIS WITHOUT CONTRAST  TECHNIQUE: Multidetector CT imaging of the abdomen and pelvis was performed following the standard protocol without IV contrast.  COMPARISON:  Plain films 07/31/2013.  CT 07/19/2011  FINDINGS: Linear dependent atelectasis in the lung bases. No effusions. Heart is borderline in size.  Prior cholecystectomy. Liver, pancreas, adrenals have an unremarkable unenhanced appearance. Calcifications throughout the spleen compatible with old granulomatous disease.  Bilateral renal cysts again noted, stable. There is mild fullness of the renal collecting system and ureters bilaterally compatible with mild hydronephrosis. The urinary bladder is markedly distended to the level of the umbilicus.  Large stool  burden in the rectosigmoid colon. Cannot exclude fecal impaction. Nonobstructive bowel gas pattern. No free fluid, free air or adenopathy.  Aorta, iliacs and branch vessels are heavily calcified.  Advanced degenerative disc disease changes throughout the lumbar spine. There is a comminuted fracture through the L3 vertebral body, similar to prior plain films. Compression fractures through the inferior endplate of L2 and superior endplate of L4. No retropulsed fracture fragments.  IMPRESSION: Severely distended urinary bladder.  Mild bilateral hydronephrosis.  Comminuted fracture through the L3 vertebral body, similar to prior plain films. Compression fractures at L2 and L4.  Large stool burden in the rectosigmoid colon. Cannot exclude fecal impaction.   Electronically Signed   By: Charlett Nose M.D.   On: 08/05/2013 15:43   Dg Chest 2 View  08/13/2013   CLINICAL DATA:  Recent vertebral fracture an urinary retention ; decreased appetite  EXAM: CHEST  2 VIEW  COMPARISON:  CT scan of the abdomen and pelvis dated August 05, 2013  FINDINGS: The lungs are adequately inflated. There is a small amount of pleural fluid layering posteriorly on the lateral film. There are coarse infrahilar lung markings on the right. The heart is mildly enlarged. The pulmonary vascularity is not engorged. The thoracic vertebral bodies are preserved in height.  IMPRESSION: Pneumonia in right lower lobe posteriorly. Tiny bilateral pleural effusions.   Electronically Signed   By: David  [place]   On: 08/13/2013 10:33   Dg Lumbar Spine Complete  07/31/2013   CLINICAL DATA:  Low back and lower extremity radicular symptoms status post fall 2 weeks ago  EXAM: LUMBAR SPINE - COMPLETE 4+ VIEW  COMPARISON:  An abdominal series of July 23, 2011  FINDINGS: There is stable partial compression of the body of L3. A prominent Schmorl's node of the superior endplate contributes to the appearance. There is new compression of the body of L3 along the mid portions  of the superior and inferior endplates. There is new abnormal inferior endplate depression at L2. There is no spondylolisthesis. There is stable disc space narrowing at L1-2 and at L4-5 and at L5-S1. The transverse processes are poorly visualized. The observed portions of the sacrum are normal.  IMPRESSION: 1. There is new compression of the superior and inferior endplates of the body of L3. The overall loss of height of the vertebral body is approximately 10%. 2. There is new inferior endplate depression of L2 possibly related to a prominent Schmorl node. 3. There are chronic degenerative changes of the L1-2, L4-5, and L5-S1 discs. There is chronic superior endplate depression of L4.   Electronically Signed   By: David  [place]   On: 07/31/2013 10:56   Dg Sacrum/coccyx  07/31/2013   CLINICAL DATA:  Status post fall 2 weeks ago with pain radiating to bilateral lower extremity  EXAM: SACRUM AND COCCYX - 2+ VIEW  COMPARISON:  None.  FINDINGS: There is no acute fracture or dislocation. Chronic changes of the distal sacrum are noted.  IMPRESSION: No acute fracture or dislocation.  Chronic changes of distal sacrum.   Electronically Signed   By: Sherian Rein M.D.   On: 07/31/2013 10:59    Microbiology: Recent Results (from the past 240 hour(s))  URINE CULTURE     Status: None   Collection Time    08/13/13  8:27 AM      Result Value Ref Range Status   Specimen Description URINE, CATHETERIZED   Final   Special Requests NONE   Final   Culture  Setup Time     Final   Value: 08/13/2013 11:05     Performed at [facility] Count     Final   Value: >=100,000 COLONIES/ML     Performed at [facility]   Culture     Final   Value: GRAM NEGATIVE RODS     Performed at [facility]   Report Status PENDING   Incomplete  CULTURE, BLOOD (ROUTINE X 2)     Status: None   Collection Time    08/13/13  2:05 PM  Result Value Ref Range Status   Specimen Description BLOOD LEFT ARM    Final   Special Requests BOTTLES DRAWN AEROBIC ONLY [facility]   Final   Culture  Setup Time     Final   Value: 08/13/2013 18:48     Performed at [facility]   Culture     Final   Value:        BLOOD CULTURE RECEIVED NO GROWTH TO DATE CULTURE WILL BE HELD FOR 5 DAYS BEFORE ISSUING A FINAL NEGATIVE REPORT     Performed at [facility]   Report Status PENDING   Incomplete  CULTURE, BLOOD (ROUTINE X 2)     Status: None   Collection Time    08/13/13  2:25 PM      Result Value Ref Range Status   Specimen Description BLOOD LEFT ARM   Final   Special Requests BOTTLES DRAWN AEROBIC AND ANAEROBIC 4CC   Final   Culture  Setup Time     Final   Value: 08/13/2013 18:48     Performed at [facility]   Culture     Final   Value:        BLOOD CULTURE RECEIVED NO GROWTH TO DATE CULTURE WILL BE HELD FOR 5 DAYS BEFORE ISSUING A FINAL NEGATIVE REPORT     Performed at [facility]   Report Status PENDING   Incomplete  URINE CULTURE     Status: None   Collection Time    08/13/13 10:10 PM      Result Value Ref Range Status   Specimen Description URINE, CATHETERIZED   Final   Special Requests NONE   Final   Culture  Setup Time     Final   Value: 08/14/2013 02:50     Performed at [facility] Count     Final   Value: >=100,000 COLONIES/ML     Performed at [facility]   Culture     Final   Value: Multiple bacterial morphotypes present, none predominant. Suggest appropriate recollection if clinically indicated.     Performed at [facility]   Report Status 08/15/2013 FINAL   Final     Labs: Basic Metabolic Panel:  Recent Labs Lab 08/13/13 0848 08/13/13 1430 08/14/13 0447 08/15/13 0455  NA 137  --  143 140  K 3.5*  --  3.4* 3.2*  CL 97  --  108 105  CO2 21  --  23 22  GLUCOSE 115*  --  89 104*  BUN 40*  --  40* 32*  CREATININE 0.81 0.74 0.79 0.60  CALCIUM 9.7  --  8.7 8.8   Liver Function Tests:  Recent Labs Lab  08/13/13 0848  AST 32  ALT 28  ALKPHOS 71  BILITOT 1.0  PROT 6.5  ALBUMIN 2.9*   No results found for this basename: LIPASE, AMYLASE,  in the last 168 hours No results found for this basename: AMMONIA,  in the last 168 hours CBC:  Recent Labs Lab 08/13/13 0846 08/13/13 1430 08/14/13 0447  WBC 14.8* 14.6* 9.9  NEUTROABS 13.1*  --   --   HGB 13.0 13.0 11.6*  HCT 37.2 37.2 33.1*  MCV 87.3 87.1 87.8  PLT 270 256 254   Cardiac Enzymes:  Recent Labs Lab 08/13/13 0846  TROPONINI <0.30   BNP: BNP (last 3 results)  Recent Labs  08/13/13 0846  PROBNP 2628.0*   CBG: No results found  for this basename: GLUCAP,  in the last 168 hours     Signed:  Eddie NorthDHUNGEL, Trampus Mcquerry  [facility] 08/15/2013, 10:13 AM

## 2013-08-15 NOTE — Evaluation (Signed)
Physical Therapy Evaluation Patient Details Name: Hayley Long MRN: [id number] DOB: Sep 09, 1920 Today's Date: 08/15/2013   History of Present Illness  80 yo female admitted with AMS, sepsis. Recent history of CT showing comminuted fracture through the L3 vertebral body, similar to prior films. Compression fractures of L2, L4 as well. Hx of A fib, HTN, CAD, dementia.    Clinical Impression  On eval, pt required Max assist +2 for mobility. Sat EOB for a few minutes to readjust brace. Significant assist needed to stand and pivot. Pt is very anxious at times and easily distracted by back pain. Max encouragement for participation and progress of activity/OOB. Daughter present-states plan is for pt to d/c home with hospice care. Family is awaiting delivery of hospital bed.     Follow Up Recommendations Supervision/Assistance - 24 hour;Home health PT (Pt to d/c home with family with hospice)    Equipment Recommendations  Hospital bed    Recommendations for Other Services       Precautions / Restrictions Precautions Precautions: Fall Required Braces or Orthoses: Spinal Brace Spinal Brace: Lumbar corset;Applied in sitting position Restrictions Weight Bearing Restrictions: No      Mobility  Bed Mobility Overal bed mobility: Needs Assistance Bed Mobility: Rolling;Sidelying to Sit Rolling: Mod assist Sidelying to sit: Mod assist;+2 for physical assistance;+2 for safety/equipment       General bed mobility comments: assist for trunk to upright. Increased time. Max encouragement for continued participation.   Transfers Overall transfer level: Needs assistance Equipment used: Rolling walker (2 wheeled) Transfers: Sit to/from Stand Sit to Stand: Max assist;+2 physical assistance;+2 safety/equipment Stand pivot transfers: Max assist;+2 physical assistance;+2 safety/equipment       General transfer comment: Assist to rise, stabilize, control descent. VCS safety, technique, hand  placement. Max encouragement for continued participation. Stand pivot from bed to recliner using walker. Pt unsafe with transfer-sat abruptly before safely positioned in front of chair.   Ambulation/Gait                Stairs            Wheelchair Mobility    Modified Rankin (Stroke Patients Only)       Balance                                             Pertinent Vitals/Pain Back pain-pt unable to rate numerically.     Home Living Family/patient expects to be discharged to:: Private residence Living Arrangements: Children Available Help at Discharge: Family Type of Home: House Home Access: Stairs to enter Entrance Stairs-Rails: None Entrance Stairs-Number of Steps: 1 Home Layout: One level Home Equipment: Environmental consultantWalker - 2 wheels;Cane - single point      Prior Function Level of Independence: Needs assistance   Gait / Transfers Assistance Needed: Max assist for transfers on last admission ~1 week ago           Hand Dominance        Extremity/Trunk Assessment   Upper Extremity Assessment: Generalized weakness           Lower Extremity Assessment: Generalized weakness RLE Deficits / Details: pt c/o pain in LEs LLE Deficits / Details: pt c/o pain in LEs  Cervical / Trunk Assessment: Kyphotic  Communication   Communication: HOH  Cognition Arousal/Alertness: Awake/alert Behavior During Therapy: Anxious Overall Cognitive Status: History of cognitive impairments - at  baseline                      General Comments      Exercises        Assessment/Plan    PT Assessment Patient needs continued PT services  PT Diagnosis Difficulty walking;Generalized weakness;Acute pain   PT Problem List Decreased strength;Pain;Decreased cognition;Decreased activity tolerance;Decreased balance;Decreased mobility;Decreased knowledge of use of DME  PT Treatment Interventions DME instruction;Functional mobility training;Therapeutic  activities;Therapeutic exercise;Patient/family education;Balance training   PT Goals (Current goals can be found in the Care Plan section) Acute Rehab PT Goals Patient Stated Goal: home with family/hospice per daughter PT Goal Formulation: With family Time For Goal Achievement: 08/22/13 Potential to Achieve Goals: Fair    Frequency Min 3X/week   Barriers to discharge        Co-evaluation               End of Session   Activity Tolerance: Patient limited by fatigue;Patient limited by pain Patient left: in chair;with call bell/phone within reach;with chair alarm set;with family/visitor present           Time: 1450-1515 PT Time Calculation (min): 25 min   Charges:     PT Treatments $Therapeutic Activity: 23-37 mins   PT G Codes:          Rebeca Alert, MPT Pager: [phone]

## 2013-08-15 NOTE — Progress Notes (Deleted)
Patient noted to have distended bladder, no drainage coming out from her foley catheter. Bladder scan done and revealed >1000 ml. On call PA was made aware, replaced foley catheter and drained 1450 ml of cloudy malodorous amber colored urine.

## 2013-08-15 NOTE — Progress Notes (Signed)
Patient noted to have distended bladder, no drainage coming out from her foley catheter. Bladder scan done and revealed >1000 ml. On call NP was made aware, foley catheter replaced and right away drained 1450 ml of foul smelling, cloudy and concentrated urine.

## 2013-08-16 LAB — URINE CULTURE

## 2013-08-19 LAB — CULTURE, BLOOD (ROUTINE X 2)
CULTURE: NO GROWTH
Culture: NO GROWTH

## 2013-09-25 ENCOUNTER — Other Ambulatory Visit: Payer: Self-pay

## 2013-09-25 LAB — URINALYSIS, COMPLETE
BILIRUBIN, UR: NEGATIVE
GLUCOSE, UR: NEGATIVE mg/dL (ref 0–75)
Ketone: NEGATIVE
NITRITE: NEGATIVE
Ph: 5 (ref 4.5–8.0)
Protein: 100
RBC,UR: 318 /HPF (ref 0–5)
SPECIFIC GRAVITY: 1.039 (ref 1.003–1.030)

## 2013-09-29 LAB — URINE CULTURE

## 2013-11-14 ENCOUNTER — Other Ambulatory Visit: Payer: Self-pay

## 2013-11-25 ENCOUNTER — Other Ambulatory Visit: Payer: Self-pay | Admitting: Cardiovascular Disease

## 2014-01-30 DEATH — deceased

## 2015-03-10 IMAGING — CT CT ABD-PELV W/O CM
2 of 4 series · 16 of 46 positions shown, 18 images · non-contrast
Comparison: Plain films 07/31/2013.  CT 07/19/2011

CLINICAL DATA: Lower abdominal pain, distention.

EXAM:
CT ABDOMEN AND PELVIS WITHOUT CONTRAST
TECHNIQUE: Multidetector CT imaging of the abdomen and pelvis was performed
following the standard protocol without IV contrast.

[Series 2: abd/pel w/o · axial · non-contrast · 0.79mm/px · z∈[-354,+1]mm · 13 of 83 slices shown, 15 images]
[im 6/83  soft-tissue]
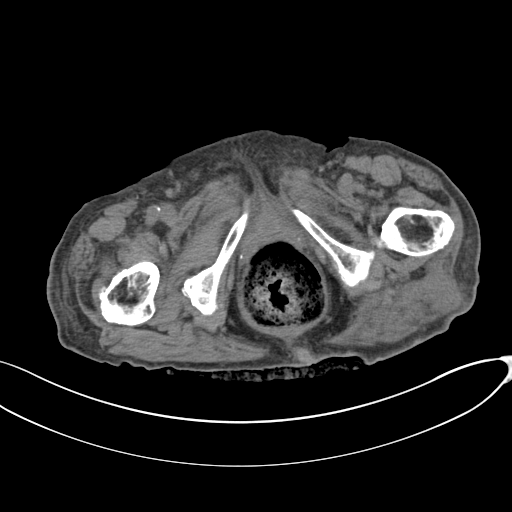
[im 6/83  bone]
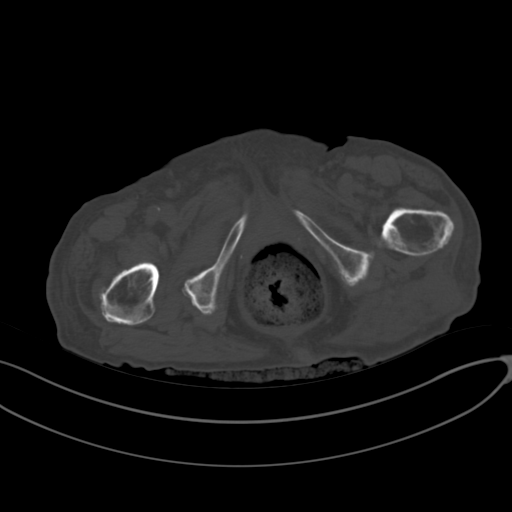
[im 11/83  soft-tissue]
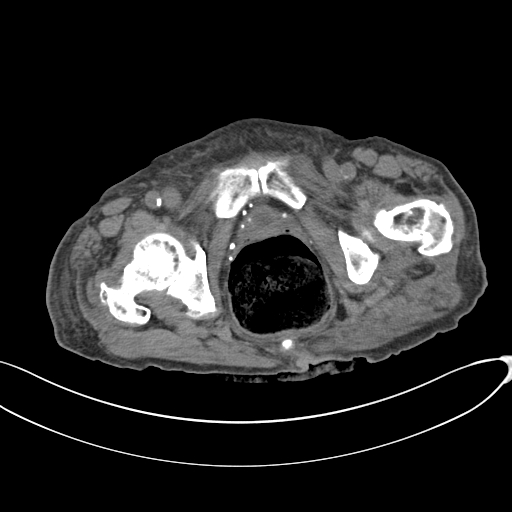
[im 17/83  soft-tissue]
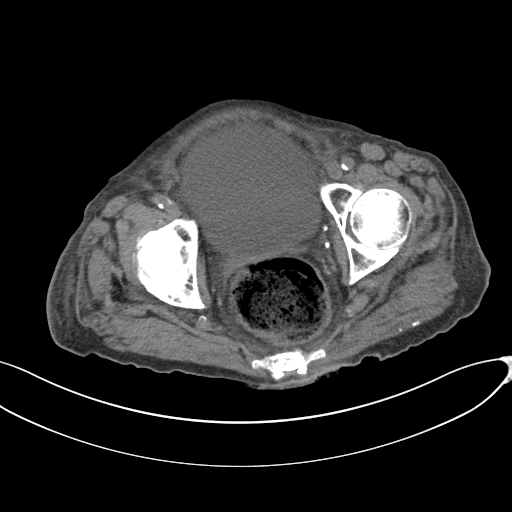
[im 22/83  soft-tissue]
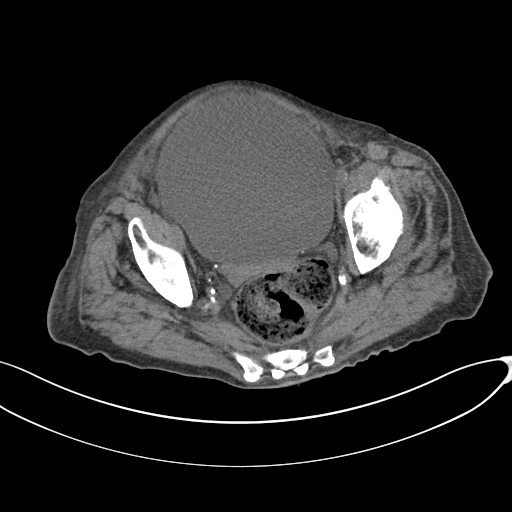
[im 28/83  soft-tissue]
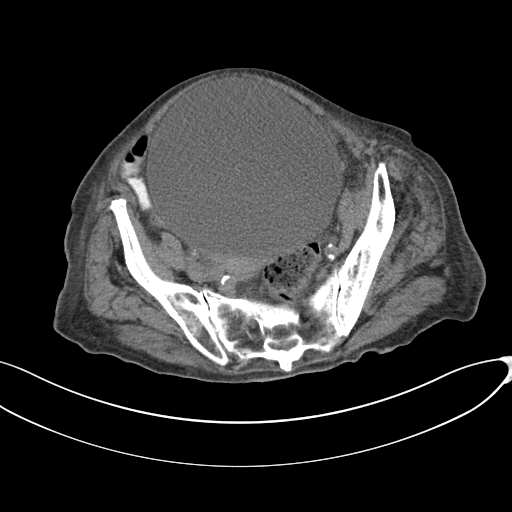
[im 33/83  soft-tissue]
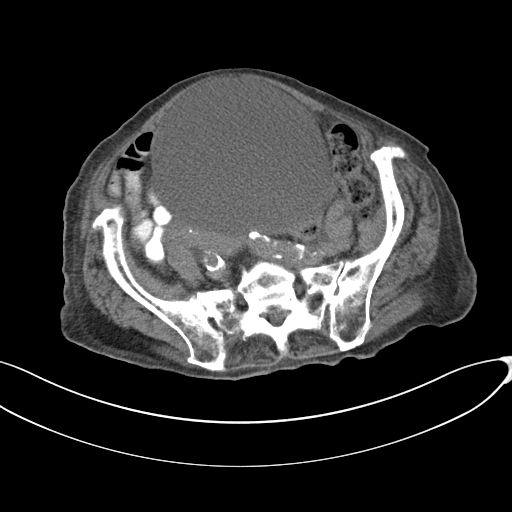
[im 44/83  soft-tissue]
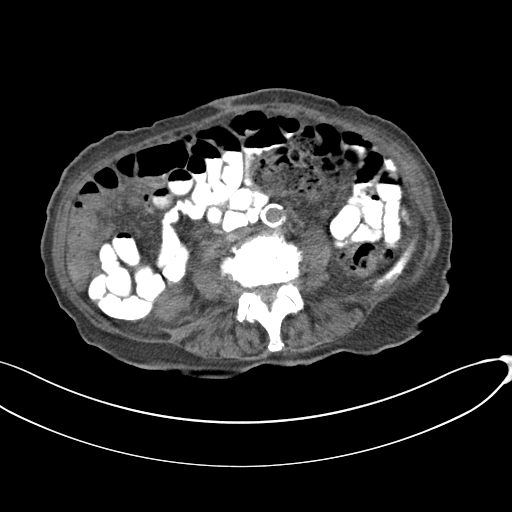
[im 50/83  soft-tissue]
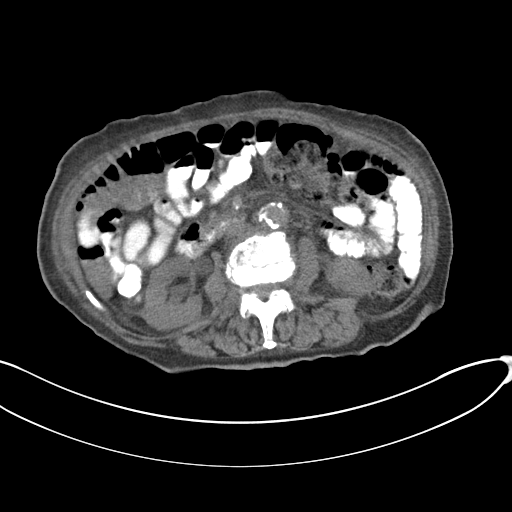
[im 55/83  soft-tissue]
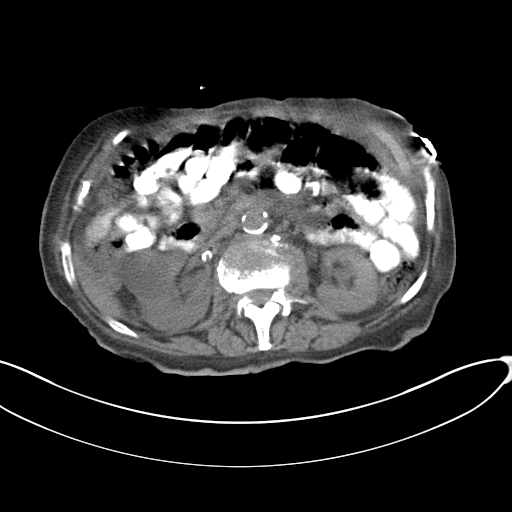
[im 55/83  bone]
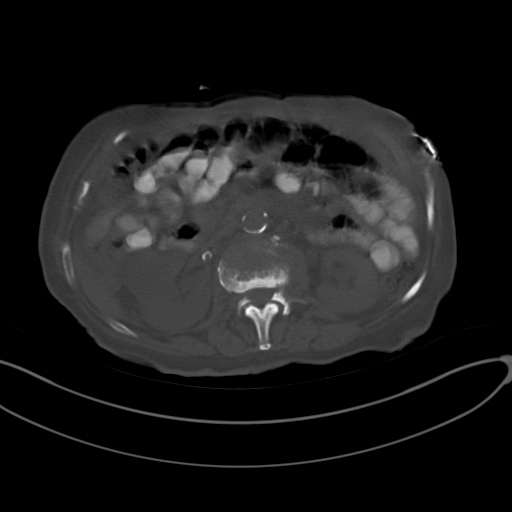
[im 61/83  soft-tissue]
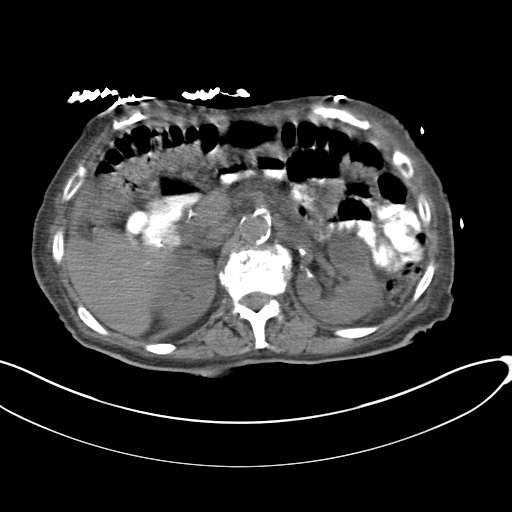
[im 66/83  soft-tissue]
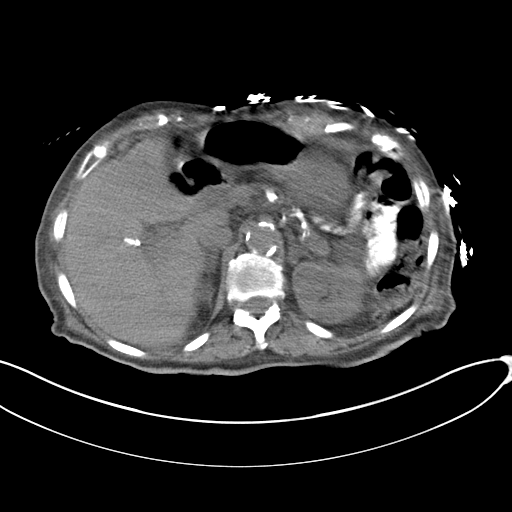
[im 72/83  soft-tissue]
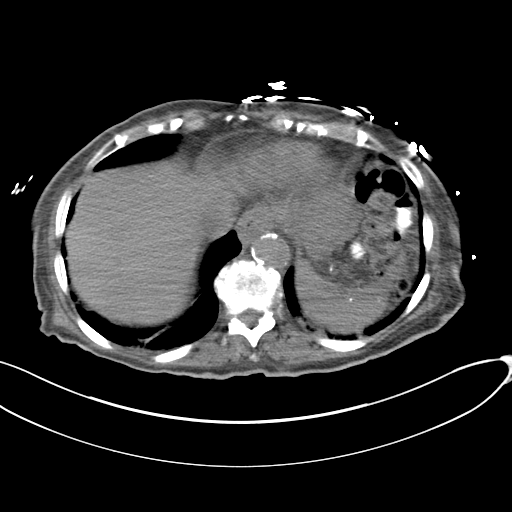
[im 77/83  soft-tissue]
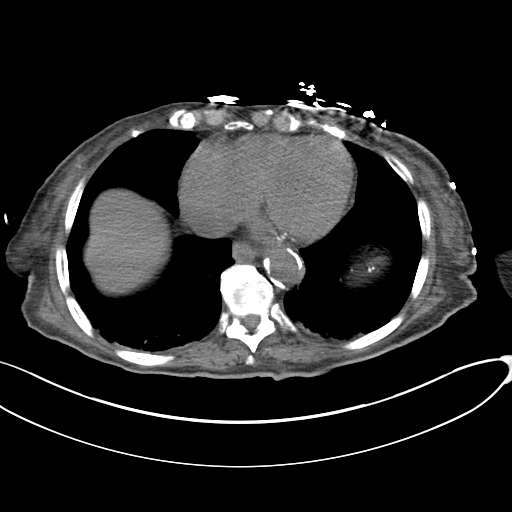

[Series 5: coronal · coronal · 0.86mm/px · 3 of 87 slices shown]
[im 29/87  soft-tissue]
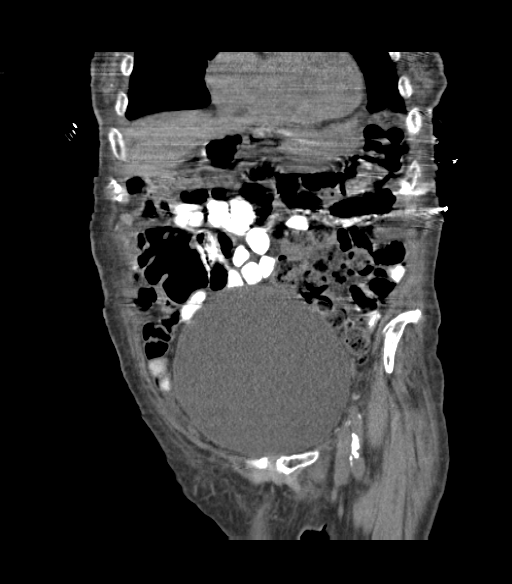
[im 39/87  soft-tissue]
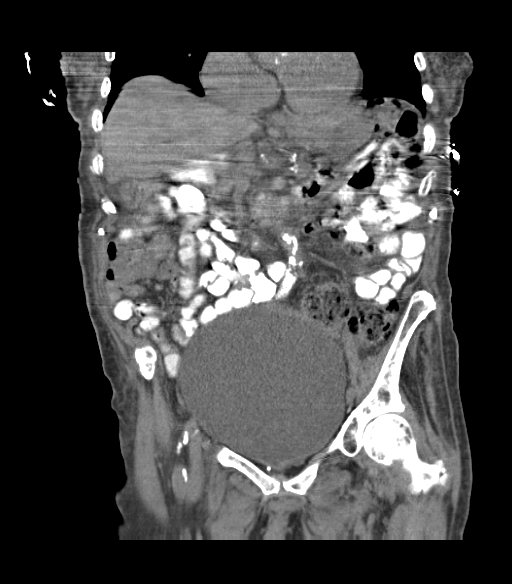
[im 48/87  soft-tissue]
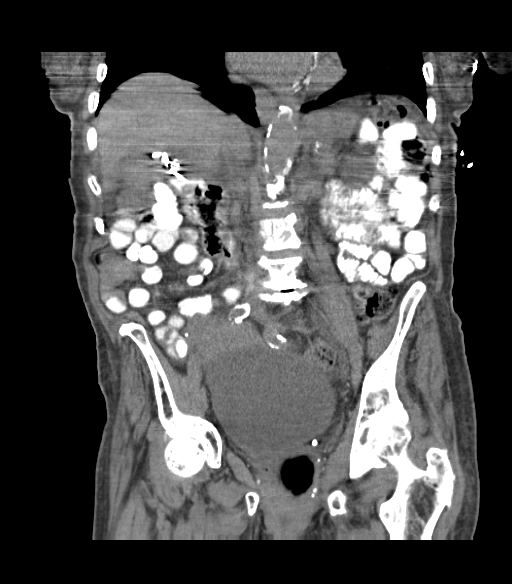

[16 of 46 positions shown; findings below may reference images not displayed]

FINDINGS: Linear dependent atelectasis in the lung bases. No effusions. Heart
is borderline in size.

Prior cholecystectomy. Liver, pancreas, adrenals have an
unremarkable unenhanced appearance. Calcifications throughout the
spleen compatible with old granulomatous disease.

Bilateral renal cysts again noted, stable. There is mild fullness of
the renal collecting system and ureters bilaterally compatible with
mild hydronephrosis. The urinary bladder is markedly distended to
the level of the umbilicus.

Large stool burden in the rectosigmoid colon. Cannot exclude fecal
impaction. Nonobstructive bowel gas pattern. No free fluid, free air
or adenopathy.

Aorta, iliacs and branch vessels are heavily calcified.

Advanced degenerative disc disease changes throughout the lumbar
spine. There is a comminuted fracture through the L3 vertebral body,
similar to prior plain films. Compression fractures through the
inferior endplate of L2 and superior endplate of L4. No retropulsed
fracture fragments.
IMPRESSION: Severely distended urinary bladder.  Mild bilateral hydronephrosis.

Comminuted fracture through the L3 vertebral body, similar to prior
plain films. Compression fractures at L2 and L4.

Large stool burden in the rectosigmoid colon. Cannot exclude fecal
impaction.
